# Patient Record
Sex: Male | Born: 1955
Health system: Southern US, Community
[De-identification: ages and names within clinical notes are randomized; demographics above are authoritative.]

## PROBLEM LIST (undated history)

## (undated) DIAGNOSIS — J984 Other disorders of lung: Secondary | ICD-10-CM

## (undated) DIAGNOSIS — J189 Pneumonia, unspecified organism: Secondary | ICD-10-CM

## (undated) DIAGNOSIS — A150 Tuberculosis of lung: Secondary | ICD-10-CM

---

## 2004-10-29 ENCOUNTER — Emergency Department (HOSPITAL_COMMUNITY): Admission: AC | Admit: 2004-10-29 | Discharge: 2004-10-29 | Payer: Self-pay

## 2010-06-04 ENCOUNTER — Emergency Department (HOSPITAL_COMMUNITY): Admission: EM | Admit: 2010-06-04 | Discharge: 2010-06-04 | Payer: Self-pay | Admitting: Family Medicine

## 2011-01-12 LAB — POCT URINALYSIS DIPSTICK
Glucose, UA: NEGATIVE mg/dL
Hgb urine dipstick: NEGATIVE
Nitrite: NEGATIVE
Urobilinogen, UA: 1 mg/dL (ref 0.0–1.0)
pH: 8 (ref 5.0–8.0)

## 2011-01-12 LAB — POCT I-STAT, CHEM 8
BUN: 13 mg/dL (ref 6–23)
Calcium, Ion: 1.18 mmol/L (ref 1.12–1.32)
Hemoglobin: 15.6 g/dL (ref 13.0–17.0)
TCO2: 29 mmol/L (ref 0–100)

## 2011-01-12 LAB — URINE CULTURE

## 2012-08-31 ENCOUNTER — Encounter (HOSPITAL_COMMUNITY): Payer: Self-pay

## 2012-08-31 ENCOUNTER — Emergency Department (HOSPITAL_COMMUNITY)
Admission: EM | Admit: 2012-08-31 | Discharge: 2012-08-31 | Disposition: A | Discharge status: Home / Self Care | Payer: 59 | Attending: Emergency Medicine | Admitting: Emergency Medicine

## 2012-08-31 DIAGNOSIS — R1013 Epigastric pain: Secondary | ICD-10-CM | POA: Insufficient documentation

## 2012-08-31 DIAGNOSIS — R109 Unspecified abdominal pain: Secondary | ICD-10-CM

## 2012-08-31 DIAGNOSIS — R7309 Other abnormal glucose: Secondary | ICD-10-CM | POA: Insufficient documentation

## 2012-08-31 DIAGNOSIS — R739 Hyperglycemia, unspecified: Secondary | ICD-10-CM

## 2012-08-31 LAB — COMPREHENSIVE METABOLIC PANEL
BUN: 17 mg/dL (ref 6–23)
CO2: 29 mEq/L (ref 19–32)
Chloride: 99 mEq/L (ref 96–112)
Creatinine, Ser: 0.82 mg/dL (ref 0.50–1.35)
GFR calc non Af Amer: >90 mL/min (ref >90)
Glucose, Bld: 147 mg/dL — ABNORMAL HIGH (ref 70–99)
Total Bilirubin: 1.1 mg/dL (ref 0.3–1.2)

## 2012-08-31 LAB — CBC WITH DIFFERENTIAL/PLATELET
HCT: 38.5 % — ABNORMAL LOW (ref 39.0–52.0)
Hemoglobin: 13.8 g/dL (ref 13.0–17.0)
Lymphocytes Relative: 11 % — ABNORMAL LOW (ref 12–46)
Monocytes Absolute: 0.3 10*3/uL (ref 0.1–1.0)
Monocytes Relative: 3 % (ref 3–12)
Neutro Abs: 7 10*3/uL (ref 1.7–7.7)
WBC: 8.2 10*3/uL (ref 4.0–10.5)

## 2012-08-31 LAB — LIPASE, BLOOD: Lipase: 29 U/L (ref 11–59)

## 2012-08-31 MED ORDER — PANTOPRAZOLE SODIUM 40 MG PO TBEC: 40.0000 mg | DELAYED_RELEASE_TABLET | Freq: Every day | ORAL | Status: DC

## 2012-08-31 MED ORDER — GI COCKTAIL ~~LOC~~
30.0000 mL | Freq: Once | ORAL | Status: AC
  Administered 2012-08-31: 30 mL via ORAL
  Filled 2012-08-31: qty 30

## 2012-08-31 MED ORDER — ONDANSETRON 4 MG PO TBDP
8.0000 mg | ORAL_TABLET | Freq: Once | ORAL | Status: AC
  Administered 2012-08-31: 8 mg via ORAL
  Filled 2012-08-31: qty 2

## 2012-08-31 NOTE — ED Provider Notes (Signed)
History     CSN: [id number]  Arrival date & time 08/31/12  2841   First MD Initiated Contact with Patient 08/31/12 0405      Chief Complaint  Patient presents with  . Abdominal Pain    (Consider location/radiation/quality/duration/timing/severity/associated sxs/prior treatment) Patient is a 56 y.o. male presenting with abdominal pain. The history is provided by the patient.  Abdominal Pain The primary symptoms of the illness include abdominal pain.  He noted onset last night at about 11 PM of epigastric pain without radiation. It was moderate in intensity. She rated pain as 6/10 at its worst. Was associated nausea and vomiting, and he feels significantly better following emesis. Denies constipation or diarrhea. Denies fever, chills, sweats. Nothing made the pain worse and nothing improves it except for vomiting. He has not had similar pain for  No past medical history on file.  No past surgical history on file.  No family history on file.  History  Substance Use Topics  . Smoking status: Never Smoker   . Smokeless tobacco: Not on file  . Alcohol Use: No      Review of Systems  Gastrointestinal: Positive for abdominal pain.  All other systems reviewed and are negative.    Allergies  Review of patient's allergies indicates no known allergies.  Home Medications   Current Outpatient Rx  Name  Route  Sig  Dispense  Refill  . IBUPROFEN 200 MG PO TABS   Oral   Take 200 mg by mouth every 6 (six) hours as needed. For pain           BP 156/73  Pulse 92  Temp 98 F (36.7 C) (Oral)  Resp 16  SpO2 98%  Physical Exam  Nursing note and vitals reviewed. 56 year old male, resting comfortably and in no acute distress. Vital signs are significant for hypertension with blood pressure 136/73. Oxygen saturation is 98%, which is normal. Head is normocephalic and atraumatic. PERRLA, EOMI. Oropharynx is clear. Neck is nontender and supple without adenopathy or JVD. Back is  nontender and there is no CVA tenderness. Lungs are clear without rales, wheezes, or rhonchi. Chest is nontender. Heart has regular rate and rhythm without murmur. Abdomen is soft, flat, nontender without masses or hepatosplenomegaly and peristalsis is normoactive. Extremities have no cyanosis or edema, full range of motion is present. Skin is warm and dry without rash. Neurologic: Mental status is normal, cranial nerves are intact, there are no motor or sensory deficits.   ED Course  Procedures (including critical care time)  Results for orders placed during the hospital encounter of 08/31/12  CBC WITH DIFFERENTIAL      Component Value Range   WBC 8.2  4.0 - 10.5 K/uL   RBC 4.58  4.22 - 5.81 MIL/uL   Hemoglobin 13.8  13.0 - 17.0 g/dL   HCT 32.4 (*) 40.1 - 02.7 %   MCV 84.1  78.0 - 100.0 fL   MCH 30.1  26.0 - 34.0 pg   MCHC 35.8  30.0 - 36.0 g/dL   RDW 25.3  66.4 - 40.3 %   Platelets 127 (*) 150 - 400 K/uL   Neutrophils Relative 86 (*) 43 - 77 %   Neutro Abs 7.0  1.7 - 7.7 K/uL   Lymphocytes Relative 11 (*) 12 - 46 %   Lymphs Abs 0.9  0.7 - 4.0 K/uL   Monocytes Relative 3  3 - 12 %   Monocytes Absolute 0.3  0.1 - 1.0  K/uL   Eosinophils Relative 0  0 - 5 %   Eosinophils Absolute 0.0  0.0 - 0.7 K/uL   Basophils Relative 0  0 - 1 %   Basophils Absolute 0.0  0.0 - 0.1 K/uL  COMPREHENSIVE METABOLIC PANEL      Component Value Range   Sodium 136  135 - 145 mEq/L   Potassium 4.3  3.5 - 5.1 mEq/L   Chloride 99  96 - 112 mEq/L   CO2 29  19 - 32 mEq/L   Glucose, Bld 147 (*) 70 - 99 mg/dL   BUN 17  6 - 23 mg/dL   Creatinine, Ser 1.61  0.50 - 1.35 mg/dL   Calcium 9.3  8.4 - 09.6 mg/dL   Total Protein 7.6  6.0 - 8.3 g/dL   Albumin 4.4  3.5 - 5.2 g/dL   AST 21  0 - 37 U/L   ALT 14  0 - 53 U/L   Alkaline Phosphatase 40  39 - 117 U/L   Total Bilirubin 1.1  0.3 - 1.2 mg/dL   GFR calc non Af Amer >90  >90 mL/min   GFR calc Af Amer >90  >90 mL/min  LIPASE, BLOOD      Component Value  Range   Lipase 29  11 - 59 U/L    1. Abdominal pain   2. Hyperglycemia       MDM  Abdominal pain of uncertain etiology. It seems to have mostly resolved. You'll be given a GI cocktail and reassessed.  The pain was significantly improved following GI cocktail. He is sent home with a prescription for pantoprazole and is advised of his blood sugars followed closely to make sure he is not diabetic.      Dione Booze, MD 08/31/12 [phone]

## 2012-08-31 NOTE — ED Notes (Signed)
Pt states understanding of discharge instructions 

## 2012-08-31 NOTE — ED Notes (Signed)
Pt here with vomiting and abd pain, onset tonight

## 2017-01-11 DIAGNOSIS — R5381 Other malaise: Secondary | ICD-10-CM | POA: Diagnosis not present

## 2017-01-11 DIAGNOSIS — K219 Gastro-esophageal reflux disease without esophagitis: Secondary | ICD-10-CM | POA: Diagnosis not present

## 2017-01-15 DIAGNOSIS — Z13220 Encounter for screening for lipoid disorders: Secondary | ICD-10-CM | POA: Diagnosis not present

## 2017-01-15 DIAGNOSIS — Z131 Encounter for screening for diabetes mellitus: Secondary | ICD-10-CM | POA: Diagnosis not present

## 2017-08-15 DIAGNOSIS — R040 Epistaxis: Secondary | ICD-10-CM | POA: Diagnosis not present

## 2017-08-15 DIAGNOSIS — I10 Essential (primary) hypertension: Secondary | ICD-10-CM | POA: Diagnosis not present

## 2017-08-23 DIAGNOSIS — I10 Essential (primary) hypertension: Secondary | ICD-10-CM | POA: Diagnosis not present

## 2017-08-23 DIAGNOSIS — K5900 Constipation, unspecified: Secondary | ICD-10-CM | POA: Diagnosis not present

## 2017-08-23 DIAGNOSIS — K649 Unspecified hemorrhoids: Secondary | ICD-10-CM | POA: Diagnosis not present

## 2017-10-09 ENCOUNTER — Encounter (HOSPITAL_COMMUNITY): Payer: Self-pay | Admitting: Emergency Medicine

## 2017-10-09 ENCOUNTER — Emergency Department (HOSPITAL_COMMUNITY): Payer: 59

## 2017-10-09 ENCOUNTER — Other Ambulatory Visit: Payer: Self-pay

## 2017-10-09 ENCOUNTER — Inpatient Hospital Stay (HOSPITAL_COMMUNITY)
Admission: EM | Admit: 2017-10-09 | Discharge: 2017-10-16 | DRG: 178 | Disposition: A | Discharge status: Home / Self Care | Payer: 59 | Attending: Family Medicine | Admitting: Family Medicine

## 2017-10-09 DIAGNOSIS — J42 Unspecified chronic bronchitis: Secondary | ICD-10-CM | POA: Diagnosis present

## 2017-10-09 DIAGNOSIS — E0781 Sick-euthyroid syndrome: Secondary | ICD-10-CM | POA: Diagnosis present

## 2017-10-09 DIAGNOSIS — R848 Other abnormal findings in specimens from respiratory organs and thorax: Secondary | ICD-10-CM | POA: Diagnosis not present

## 2017-10-09 DIAGNOSIS — J189 Pneumonia, unspecified organism: Secondary | ICD-10-CM | POA: Diagnosis not present

## 2017-10-09 DIAGNOSIS — Z789 Other specified health status: Secondary | ICD-10-CM

## 2017-10-09 DIAGNOSIS — R000 Tachycardia, unspecified: Secondary | ICD-10-CM | POA: Diagnosis not present

## 2017-10-09 DIAGNOSIS — J984 Other disorders of lung: Secondary | ICD-10-CM | POA: Diagnosis not present

## 2017-10-09 DIAGNOSIS — J188 Other pneumonia, unspecified organism: Secondary | ICD-10-CM | POA: Diagnosis present

## 2017-10-09 DIAGNOSIS — J181 Lobar pneumonia, unspecified organism: Secondary | ICD-10-CM

## 2017-10-09 DIAGNOSIS — A150 Tuberculosis of lung: Secondary | ICD-10-CM

## 2017-10-09 DIAGNOSIS — R0902 Hypoxemia: Secondary | ICD-10-CM

## 2017-10-09 DIAGNOSIS — R042 Hemoptysis: Secondary | ICD-10-CM | POA: Diagnosis present

## 2017-10-09 DIAGNOSIS — R590 Localized enlarged lymph nodes: Secondary | ICD-10-CM

## 2017-10-09 DIAGNOSIS — R079 Chest pain, unspecified: Secondary | ICD-10-CM | POA: Diagnosis not present

## 2017-10-09 HISTORY — DX: Other disorders of lung: J98.4

## 2017-10-09 HISTORY — DX: Pneumonia, unspecified organism: J18.9

## 2017-10-09 HISTORY — DX: Tuberculosis of lung: A15.0

## 2017-10-09 LAB — BASIC METABOLIC PANEL
ANION GAP: 10 (ref 5–15)
BUN: 10 mg/dL (ref 6–20)
CALCIUM: 9.2 mg/dL (ref 8.9–10.3)
CO2: 27 mmol/L (ref 22–32)
CREATININE: 0.88 mg/dL (ref 0.61–1.24)
Chloride: 99 mmol/L — ABNORMAL LOW (ref 101–111)
GLUCOSE: 104 mg/dL — AB (ref 65–99)
Potassium: 3.9 mmol/L (ref 3.5–5.1)
Sodium: 136 mmol/L (ref 135–145)

## 2017-10-09 LAB — CBC
HCT: 37.3 % — ABNORMAL LOW (ref 39.0–52.0)
Hemoglobin: 12.8 g/dL — ABNORMAL LOW (ref 13.0–17.0)
MCH: 29.6 pg (ref 26.0–34.0)
MCHC: 34.3 g/dL (ref 30.0–36.0)
MCV: 86.3 fL (ref 78.0–100.0)
PLATELETS: 206 10*3/uL (ref 150–400)
RBC: 4.32 MIL/uL (ref 4.22–5.81)
RDW: 12.8 % (ref 11.5–15.5)
WBC: 6.6 10*3/uL (ref 4.0–10.5)

## 2017-10-09 LAB — RAPID HIV SCREEN (HIV 1/2 AB+AG)
HIV 1/2 ANTIBODIES: NONREACTIVE
HIV-1 P24 ANTIGEN - HIV24: NONREACTIVE

## 2017-10-09 LAB — I-STAT TROPONIN, ED: TROPONIN I, POC: 0 ng/mL (ref 0.00–0.08)

## 2017-10-09 MED ORDER — DEXTROSE 5 % IV SOLN
1.0000 g | INTRAVENOUS | Status: AC
  Administered 2017-10-09 – 2017-10-15 (×7): 1 g via INTRAVENOUS
  Filled 2017-10-09 (×6): qty 10

## 2017-10-09 MED ORDER — DEXTROSE 5 % IV SOLN
1.0000 g | Freq: Once | INTRAVENOUS | Status: DC
  Filled 2017-10-09: qty 10

## 2017-10-09 MED ORDER — AZITHROMYCIN 250 MG PO TABS
500.0000 mg | ORAL_TABLET | Freq: Once | ORAL | Status: AC
  Administered 2017-10-09: 500 mg via ORAL
  Filled 2017-10-09: qty 2

## 2017-10-09 MED ORDER — AZITHROMYCIN 250 MG PO TABS
500.0000 mg | ORAL_TABLET | ORAL | Status: DC
  Administered 2017-10-10 – 2017-10-13 (×4): 500 mg via ORAL
  Filled 2017-10-09 (×4): qty 2

## 2017-10-09 NOTE — H&P (Signed)
History and Physical    Jason Simpsondi Saylor ZOX:[id number] DOB: 01/03/1956 DOA: 10/09/2017  PCP: Rometta EmeryGarba, Mohammad L, MD  Patient coming from: Home  I have personally briefly reviewed patient's old medical [facility]  Chief Complaint: CP  HPI: Jason Clark is a 61 y.o. male with medical history significant of previously healthy.  Patient presents to ED with c/o CP that waxes and wanes for the last 3 weeks.  This has been getting worse especially since last Friday.  L sided chest pain, tightness and pressure; w/ SOB.  Pt able to ambulate to chair.  6/10 Pain.  Associated cough and hemoptysis over the weekend.   ED Course: CXR shows LEFT suprahilar density.  CT showed dense airspace consolidation of the anterior left upper lobe with small areas of internal cavitation C/W acute PNA.  Also hilar lymph nodes, either prior granulomatous disease or active TB.  Review of Systems: As per HPI otherwise 10 point review of systems negative.   History reviewed. No pertinent past medical history.  History reviewed. No pertinent surgical history.   reports that  has never smoked. he has never used smokeless tobacco. He reports that he does not drink alcohol or use drugs.  No Known Allergies  No family history on file. No known exposure to TB, no family members with TB he says.  Prior to Admission medications   Medication Sig Start Date End Date Taking? Authorizing Provider  ibuprofen (ADVIL) 200 MG tablet Take 200 mg by mouth every 6 (six) hours as needed. For pain    [provider]  pantoprazole (PROTONIX) 40 MG tablet Take 1 tablet (40 mg total) by mouth daily. 08/31/12   Dione BoozeGlick, David, MD    Physical Exam: Vitals:   10/09/17 1331 10/09/17 1749 10/09/17 1926  BP: (!) 147/88 (!) 150/79 (!) 150/81  Pulse: (!) 110 (!) 102 100  Resp: 16 18 18   Temp: 98 F (36.7 C)    SpO2: 100% 99% 97%  Weight: 74.8 kg (165 lb)    Height: 5\' 6"  (1.676 m)      Constitutional: NAD, calm,  comfortable Eyes: PERRL, lids and conjunctivae normal ENMT: Mucous membranes are moist. Posterior pharynx clear of any exudate or lesions.Normal dentition.  Neck: normal, supple, no masses, no thyromegaly Respiratory: clear to auscultation bilaterally, no wheezing, no crackles. Normal respiratory effort. No accessory muscle use.  Cardiovascular: Regular rate and rhythm, no murmurs / rubs / gallops. No extremity edema. 2+ pedal pulses. No carotid bruits.  Abdomen: no tenderness, no masses palpated. No hepatosplenomegaly. Bowel sounds positive.  Musculoskeletal: no clubbing / cyanosis. No joint deformity upper and lower extremities. Good ROM, no contractures. Normal muscle tone.  Skin: no rashes, lesions, ulcers. No induration Neurologic: CN 2-12 grossly intact. Sensation intact, DTR normal. Strength 5/5 in all 4.  Psychiatric: Normal judgment and insight. Alert and oriented x 3. Normal mood.    Labs on Admission: I have personally reviewed following labs and imaging studies  CBC: Recent Labs  Lab 10/09/17 1333  WBC 6.6  HGB 12.8*  HCT 37.3*  MCV 86.3  PLT 206   Basic Metabolic Panel: Recent Labs  Lab 10/09/17 1333  NA 136  K 3.9  CL 99*  CO2 27  GLUCOSE 104*  BUN 10  CREATININE 0.88  CALCIUM 9.2   GFR: Estimated Creatinine Clearance: 81.6 mL/min (by C-G formula based on SCr of 0.88 mg/dL). Liver Function Tests: No results for input(s): AST, ALT, ALKPHOS, BILITOT, PROT, ALBUMIN  in the last 168 hours. No results for input(s): LIPASE, AMYLASE in the last 168 hours. No results for input(s): AMMONIA in the last 168 hours. Coagulation Profile: No results for input(s): INR, PROTIME in the last 168 hours. Cardiac Enzymes: No results for input(s): CKTOTAL, CKMB, CKMBINDEX, TROPONINI in the last 168 hours. BNP (last 3 results) No results for input(s): PROBNP in the last 8760 hours. HbA1C: No results for input(s): HGBA1C in the last 72 hours. CBG: No results for input(s):  GLUCAP in the last 168 hours. Lipid Profile: No results for input(s): CHOL, HDL, LDLCALC, TRIG, CHOLHDL, LDLDIRECT in the last 72 hours. Thyroid Function Tests: No results for input(s): TSH, T4TOTAL, FREET4, T3FREE, THYROIDAB in the last 72 hours. Anemia Panel: No results for input(s): VITAMINB12, FOLATE, FERRITIN, TIBC, IRON, RETICCTPCT in the last 72 hours. Urine analysis:    Component Value Date/Time   LABSPEC 1.020 06/04/2010 1553   PHURINE 8.0 06/04/2010 1553   GLUCOSEU NEGATIVE 06/04/2010 1553   HGBUR NEGATIVE 06/04/2010 1553   BILIRUBINUR NEGATIVE 06/04/2010 1553   KETONESUR NEGATIVE 06/04/2010 1553   PROTEINUR NEGATIVE 06/04/2010 1553   UROBILINOGEN 1.0 06/04/2010 1553   NITRITE NEGATIVE 06/04/2010 1553   LEUKOCYTESUR (A) 06/04/2010 1553    SMALL Biochemical Testing Only. Please order routine urinalysis from main lab if confirmatory testing is needed.    Radiological Exams on Admission: Dg Chest 2 View  Result Date: 10/09/2017 CLINICAL DATA:  Chest pain since friday EXAM: CHEST  2 VIEW COMPARISON:  Radiograph 11 10/2004. FINDINGS: Normal cardiac silhouette. There is subtle asymmetric LEFT suprahilar angular density not clearly seen on comparison exam. No consolidation. No pleural fluid or pneumothorax. No acute osseous abnormality. IMPRESSION: Subtle asymmetric LEFT suprahilar density. Recommend CT thorax for further evaluation Electronically Signed   By: Genevive Bi M.D.   On: 10/09/2017 14:00   Ct Chest Wo Contrast  Result Date: 10/09/2017 CLINICAL DATA:  Left-sided chest pain and shortness of breath. Abnormal chest x-ray demonstrating suprahilar density in the left chest. EXAM: CT CHEST WITHOUT CONTRAST TECHNIQUE: Multidetector CT imaging of the chest was performed following the standard protocol without IV contrast. COMPARISON:  Chest x-ray earlier today. FINDINGS: Cardiovascular: Normal heart size. No pericardial fluid. Normal caliber thoracic aorta and central  pulmonary arteries. No evidence of calcified coronary artery plaque. Mediastinum/Nodes: Numerous small high density calcified lymph nodes are identified throughout the mediastinum and in both hilar regions. Findings are suggestive of either prior granulomatous infection or other treated disease. No enlarged lymph nodes are identified. Lungs/Pleura: Dense subpleural airspace consolidation of the anterior left upper lobe noted extending from the anterior hilum and abutting the subpleural lung anteriorly and medially bordering the anterior mediastinum. There are some small pockets of cavitation within the consolidated lung and findings are consistent with acute pneumonia. Given the evidence potential prior granulomatous disease, active tuberculosis should be excluded. Upper Abdomen: Calcified gallstones identified in the gallbladder lumen. Musculoskeletal: No chest wall mass or suspicious bone lesions identified. IMPRESSION: Dense airspace consolidation of the anterior left upper lobe with small areas of internal cavitation. Findings are consistent with acute pneumonia. Associated numerous high density calcified lymph nodes throughout the mediastinum and in both hilar regions. These nodes are suggestive of either prior granulomatous infection or other treated disease. Active tuberculosis should be excluded based on the presence of these high density lymph nodes and the pneumonic consolidation present. Electronically Signed   By: Irish Lack M.D.   On: 10/09/2017 18:12    EKG: Independently  reviewed.  Assessment/Plan Principal Problem:   Pulmonary cavitary lesion Active Problems:   Community acquired pneumonia of left upper lobe of lung (HCC)    1. Pulmonary cavitary lesions, mediastinal lymphadenopathy - DDx includes but not limited to CAP, MTB, other infection, neoplasm. 1. EDP spoke with Dr. Orvan Falconerampbell who will presumably see inpatient 2. On airborne isolation already 3. CAP pathway for  now 4. Rocephin / azithromycin 5. Sputum cultures 6. Sputum AFB smears and cultures 7. Rapid HIV screen  DVT prophylaxis: SCDs Code Status: Full Family Communication: No family in room Disposition Plan: Home after admit Consults called: ID Dr. Orvan Falconerampbell Admission status: Admit to inpatient - inpatient status for work up of cavitary lung lesions, rule out MTB   Hillary BowGARDNER, JARED M. DO [facility] Pager [phone]  If 7AM-7PM, please contact day team taking care of patient [email or web address] Password [email or web address]  10/09/2017, 8:32 PM

## 2017-10-09 NOTE — ED Notes (Signed)
IV did not give blood return.

## 2017-10-09 NOTE — ED Notes (Signed)
Urinal given to patient. Patient aware urine sample needed.

## 2017-10-09 NOTE — ED Notes (Signed)
Patient not coughing up sputum at this time.  Patient aware sputum sample needed.

## 2017-10-09 NOTE — ED Notes (Signed)
Room on 6N being cleaned and not ready. Nurse will call when it has been finished.

## 2017-10-09 NOTE — ED Notes (Signed)
Two unsuccessful attempt at IV access.

## 2017-10-09 NOTE — ED Triage Notes (Signed)
Per Pt he has been having centralized CP that waxes and wanes for the last 3 weeks. Pt comes into the ED today bc it has gotten worse.  L sided chest pain, tightness and pressure; w/ SOB.  Pt able to ambulate to chair. 6/10 Pain.

## 2017-10-09 NOTE — ED Provider Notes (Signed)
[facility] Provider Note   CSN: [id number] Arrival date & time: 10/09/17  1321     History   Chief Complaint Chief Complaint  Patient presents with  . Chest Pain    HPI Jason Clark is a 61 y.o. male who originates from [place].  He has been living here for the past 17 years.  He presents the emergency department with a chief complaint of cough.  Translation services were offered however the patient declined them and said that he wanted to speak to me in [place].  His primary language is [place] and [place].   He is followed by Dr. Mikeal HawthorneGarba.  The patient has had a persistent cough for some time.  Intermittently he will take NyQuil or DayQuil.  Over the past week it seems to have increased in frequency.  On Friday his coughing became significant with left-sided pleuritic chest pain.  He had one episode of hemoptysis.  He is coughing up sputum.  He denies fevers, chills or weight loss.  He denies wheezing but does complain of dyspnea both at rest and with exertion as well as pain with breathing.  The patient had an abnormal chest x-ray followed by a follow-up CT chest concerning for active tuberculosis while waiting in the ER lobby.    HPI  History reviewed. No pertinent past medical history.  There are no active problems to display for this patient.   History reviewed. No pertinent surgical history.     Home Medications    Prior to Admission medications   Medication Sig Start Date End Date Taking? Authorizing Provider  ibuprofen (ADVIL) 200 MG tablet Take 200 mg by mouth every 6 (six) hours as needed. For pain    [provider]  pantoprazole (PROTONIX) 40 MG tablet Take 1 tablet (40 mg total) by mouth daily. 08/31/12   Dione BoozeGlick, David, MD    Family History No family history on file.  Social History Social History   Tobacco Use  . Smoking status: Never Smoker  . Smokeless tobacco: Never Used  Substance Use Topics  . Alcohol use: No  .  Drug use: No     Allergies   Patient has no known allergies.   Review of Systems Review of Systems  Ten systems reviewed and are negative for acute change, except as noted in the HPI.   Physical Exam Updated Vital Signs BP (!) 150/81   Pulse 100   Temp 98 F (36.7 C)   Resp 18   Ht 5\' 6"  (1.676 m)   Wt 74.8 kg (165 lb)   SpO2 97%   BMI 26.63 kg/m   Physical Exam  Constitutional: He is oriented to person, place, and time. He appears well-developed and well-nourished. No distress.  HENT:  Head: Normocephalic and atraumatic.  Eyes: Conjunctivae and EOM are normal. Pupils are equal, round, and reactive to light. No scleral icterus.  Neck: Normal range of motion. Neck supple.  Cardiovascular: Normal rate, regular rhythm, normal heart sounds, intact distal pulses and normal pulses.  Pulmonary/Chest: Effort normal and breath sounds normal. No respiratory distress. He has no wheezes. He has no rhonchi.  Hyperresonance in the left upper lung field  Abdominal: Soft. There is no tenderness.  Musculoskeletal: Normal range of motion. He exhibits no edema.  Neurological: He is alert and oriented to person, place, and time.  Skin: Skin is warm and dry. He is not diaphoretic.  Psychiatric: His behavior is normal.  Nursing note and vitals  reviewed.    ED Treatments / Results  Labs (all labs ordered are listed, but only abnormal results are displayed) Labs Reviewed  BASIC METABOLIC PANEL - Abnormal; Notable for the following components:      Result Value   Chloride 99 (*)    Glucose, Bld 104 (*)    All other components within normal limits  CBC - Abnormal; Notable for the following components:   Hemoglobin 12.8 (*)    HCT 37.3 (*)    All other components within normal limits  ACID FAST SMEAR (AFB)  ACID FAST CULTURE WITH REFLEXED SENSITIVITIES  CULTURE, BLOOD (ROUTINE X 2)  CULTURE, BLOOD (ROUTINE X 2)  RAPID HIV SCREEN (HIV 1/2 AB+AG)  I-STAT TROPONIN, ED    EKG  EKG  Interpretation None       Radiology Dg Chest 2 View  Result Date: 10/09/2017 CLINICAL DATA:  Chest pain since friday EXAM: CHEST  2 VIEW COMPARISON:  Radiograph 11 10/2004. FINDINGS: Normal cardiac silhouette. There is subtle asymmetric LEFT suprahilar angular density not clearly seen on comparison exam. No consolidation. No pleural fluid or pneumothorax. No acute osseous abnormality. IMPRESSION: Subtle asymmetric LEFT suprahilar density. Recommend CT thorax for further evaluation Electronically Signed   By: Genevive BiStewart  Edmunds M.D.   On: 10/09/2017 14:00   Ct Chest Wo Contrast  Result Date: 10/09/2017 CLINICAL DATA:  Left-sided chest pain and shortness of breath. Abnormal chest x-ray demonstrating suprahilar density in the left chest. EXAM: CT CHEST WITHOUT CONTRAST TECHNIQUE: Multidetector CT imaging of the chest was performed following the standard protocol without IV contrast. COMPARISON:  Chest x-ray earlier today. FINDINGS: Cardiovascular: Normal heart size. No pericardial fluid. Normal caliber thoracic aorta and central pulmonary arteries. No evidence of calcified coronary artery plaque. Mediastinum/Nodes: Numerous small high density calcified lymph nodes are identified throughout the mediastinum and in both hilar regions. Findings are suggestive of either prior granulomatous infection or other treated disease. No enlarged lymph nodes are identified. Lungs/Pleura: Dense subpleural airspace consolidation of the anterior left upper lobe noted extending from the anterior hilum and abutting the subpleural lung anteriorly and medially bordering the anterior mediastinum. There are some small pockets of cavitation within the consolidated lung and findings are consistent with acute pneumonia. Given the evidence potential prior granulomatous disease, active tuberculosis should be excluded. Upper Abdomen: Calcified gallstones identified in the gallbladder lumen. Musculoskeletal: No chest wall mass or  suspicious bone lesions identified. IMPRESSION: Dense airspace consolidation of the anterior left upper lobe with small areas of internal cavitation. Findings are consistent with acute pneumonia. Associated numerous high density calcified lymph nodes throughout the mediastinum and in both hilar regions. These nodes are suggestive of either prior granulomatous infection or other treated disease. Active tuberculosis should be excluded based on the presence of these high density lymph nodes and the pneumonic consolidation present. Electronically Signed   By: Irish LackGlenn  Yamagata M.D.   On: 10/09/2017 18:12    Procedures Procedures (including critical care time)  Medications Ordered in ED Medications  cefTRIAXone (ROCEPHIN) 1 g in dextrose 5 % 50 mL IVPB (not administered)  azithromycin (ZITHROMAX) tablet 500 mg (not administered)     Initial Impression / Assessment and Plan / ED Course  I have reviewed the triage vital signs and the nursing notes.  Pertinent labs & imaging results that were available during my care of the patient were reviewed by me and considered in my medical decision making (see chart for details).  Clinical Course as of Oct 10 2107  Wed Oct 09, 2017  1720 Chest x-ray with subtle asymmetric left suprahilar density, called and spoke with reading radiologist, Dr. Amil Amen, regarding f/u CT. Recommended non-contrast chest CT, which I have ordered.   [MM]  1845 Reviewed CT impression, concerning for active TB. Spoke with charge RN who will leave the patient in a negative pressure room.   [MM]  1953 I discussed the case with Dr. Orvan Falconer who is on-call for infectious disease.  He recommends inpatient admission, screening for HIV, acid-fast bacilli sputum assessment, blood cultures.  I have placed consult call with her hospitalist.  Patient will also be treated with azithromycin and Rocephin for community-acquired pneumonia.  [AH]    Clinical Course User Index [AH] Arthor Captain,  PA-C [MM] McDonald, Coral Else, PA-C      Final Clinical Impressions(s) / ED Diagnoses   Final diagnoses:  None    ED Discharge Orders    None       Arthor Captain, PA-C 10/10/17 0149    Nira Conn, MD 10/12/17 [phone]

## 2017-10-10 ENCOUNTER — Other Ambulatory Visit: Payer: Self-pay

## 2017-10-10 ENCOUNTER — Encounter (HOSPITAL_COMMUNITY): Payer: Self-pay | Admitting: General Practice

## 2017-10-10 DIAGNOSIS — J189 Pneumonia, unspecified organism: Secondary | ICD-10-CM

## 2017-10-10 LAB — STREP PNEUMONIAE URINARY ANTIGEN: STREP PNEUMO URINARY ANTIGEN: NEGATIVE

## 2017-10-10 NOTE — Consult Note (Addendum)
[facility]    Date of Admission:  10/09/2017   Total days of antibiotics: 1 caftriaxone/azithro               Reason for Consult: Cavitary pneumonia     Referring Provider: Hongalgi   Assessment: Cavitary Pneumonia  Plan: 1. Continue rx for CAP 2. Induce sputum for AFB, will ask repsiratory (per nursing he is not producing sputum) 3. If not able to get sputum, BAL 4. HIV (-) 5. No need for quantiferon gold testing in acute setting- if we believe he has active TB, best to go to the source.   Comment-  Very suspicious of Tb given his country of origin and CT and hemoptysis.   Thank you so much for this interesting consult,  Principal Problem:   Pulmonary cavitary lesion Active Problems:   Community acquired pneumonia of left upper lobe of lung ([place])   . azithromycin  500 mg Oral Q24H    HPI: Jason Clark is a 61 y.o. male with no PMHx, originally from [place], came to [place] "2000 something" . Last visit to his home country was  (can't remember).  No hx of incarceration, homelessness.  Comes to ED on 12-12 with CP for 3 weeks and cough and hemoptysis over the last 4 days.  No Fever, no Chills, no wt loss, no LAN.  In ED his CXR showed L suprahilar density, CT showed LUL infiltrate with cavitation and hilar LN. He was afebrile, CBC normal.  He was started on azithro, ceftriaxone, placed isolation.    Review of Systems: Review of Systems  Constitutional: Negative for chills, fever and weight loss.  Respiratory: Positive for cough, hemoptysis and sputum production.   Cardiovascular: Positive for chest pain.  Gastrointestinal: Negative for constipation and diarrhea.  Genitourinary: Negative for dysuria.    Past Medical History:  Diagnosis Date  . CAP (community acquired pneumonia) 10/09/2017  . Pulmonary cavitary lesion 10/09/2017    Social History   Tobacco Use  . Smoking status: Never Smoker  . Smokeless tobacco: Never Used    Substance Use Topics  . Alcohol use: No  . Drug use: No    History reviewed. No pertinent family history.  The past medical history, family history and social history were reviewed/updated in EPIC   Medications:  Scheduled: . azithromycin  500 mg Oral Q24H    Abtx:  Anti-infectives (From admission, onward)   Start     Dose/Rate Route Frequency Ordered Stop   10/10/17 2000  azithromycin (ZITHROMAX) tablet 500 mg     500 mg Oral Every 24 hours 10/09/17 2023 10/17/17 1959   10/09/17 2100  cefTRIAXone (ROCEPHIN) 1 g in dextrose 5 % 50 mL IVPB     1 g 100 mL/hr over 30 Minutes Intravenous Every 24 hours 10/09/17 2023 10/16/17 2059   10/09/17 1945  cefTRIAXone (ROCEPHIN) 1 g in dextrose 5 % 50 mL IVPB  Status:  Discontinued     1 g 100 mL/hr over 30 Minutes Intravenous  Once 10/09/17 1939 10/09/17 2026   10/09/17 1945  azithromycin (ZITHROMAX) tablet 500 mg     500 mg Oral  Once 10/09/17 1939 10/09/17 2016        OBJECTIVE: Blood pressure 117/71, pulse 98, temperature 98.6 F (37 C), temperature source Oral, resp. rate 18, height _0  (1.727 m), weight 74.8 kg (165 lb), SpO2 100 %.  Physical Exam  Constitutional: He is well-developed,  well-nourished, and in no distress. No distress.  HENT:  Mouth/Throat: Abnormal dentition. No oropharyngeal exudate.  Eyes: EOM are normal. Pupils are equal, round, and reactive to light.  Neck: Neck supple.  Cardiovascular: Normal rate, regular rhythm and normal heart sounds.  Pulmonary/Chest: Effort normal. He has rhonchi.  Abdominal: Soft. Bowel sounds are normal. There is no tenderness. There is no rebound.  Musculoskeletal: He exhibits no edema.  Lymphadenopathy:    He has no cervical adenopathy.    He has no axillary adenopathy.       Right: No supraclavicular adenopathy present.       Left: No supraclavicular adenopathy present.  Skin: He is not diaphoretic.  Psychiatric: Affect normal.    Lab Results Results for orders  placed or performed during the hospital encounter of 10/09/17 (from the past 48 hour(s))  Basic metabolic panel     Status: Abnormal   Collection Time: 10/09/17  1:33 PM  Result Value Ref Range   Sodium 136 135 - 145 mmol/L   Potassium 3.9 3.5 - 5.1 mmol/L   Chloride 99 (L) 101 - 111 mmol/L   CO2 27 22 - 32 mmol/L   Glucose, Bld 104 (H) 65 - 99 mg/dL   BUN 10 6 - 20 mg/dL   Creatinine, Ser 0.88 0.61 - 1.24 mg/dL   Calcium 9.2 8.9 - 10.3 mg/dL   GFR calc non Af Amer >60 >60 mL/min   GFR calc Af Amer >60 >60 mL/min    Comment: (NOTE) The eGFR has been calculated using the CKD EPI equation. This calculation has not been validated in all clinical situations. eGFR's persistently <60 mL/min signify possible Chronic Kidney Disease.    Anion gap 10 5 - 15  CBC     Status: Abnormal   Collection Time: 10/09/17  1:33 PM  Result Value Ref Range   WBC 6.6 4.0 - 10.5 K/uL   RBC 4.32 4.22 - 5.81 MIL/uL   Hemoglobin 12.8 (L) 13.0 - 17.0 g/dL   HCT 37.3 (L) 39.0 - 52.0 %   MCV 86.3 78.0 - 100.0 fL   MCH 29.6 26.0 - 34.0 pg   MCHC 34.3 30.0 - 36.0 g/dL   RDW 12.8 11.5 - 15.5 %   Platelets 206 150 - 400 K/uL  I-stat troponin, ED     Status: None   Collection Time: 10/09/17  1:43 PM  Result Value Ref Range   Troponin i, poc 0.00 0.00 - 0.08 ng/mL   Comment 3            Comment: Due to the release kinetics of cTnI, a negative result within the first hours of the onset of symptoms does not rule out myocardial infarction with certainty. If myocardial infarction is still suspected, repeat the test at appropriate intervals.   Blood culture (routine x 2)     Status: None (Preliminary result)   Collection Time: 10/09/17  8:15 PM  Result Value Ref Range   Specimen Description BLOOD LEFT ANTECUBITAL    Special Requests      BOTTLES DRAWN AEROBIC AND ANAEROBIC Blood Culture adequate volume   Culture NO GROWTH < 24 HOURS    Report Status PENDING   Rapid HIV screen (HIV 1/2 Ab+Ag)     Status:  None   Collection Time: 10/09/17  8:40 PM  Result Value Ref Range   HIV-1 P24 Antigen - HIV24 NON REACTIVE NON REACTIVE   HIV 1/2 Antibodies NON REACTIVE NON REACTIVE   Interpretation (HIV  Ag Ab)      A non reactive test result means that HIV 1 or HIV 2 antibodies and HIV 1 p24 antigen were not detected in the specimen.  Blood culture (routine x 2)     Status: None (Preliminary result)   Collection Time: 10/09/17  8:47 PM  Result Value Ref Range   Specimen Description BLOOD LEFT ARM    Special Requests      BLOOD AEROBIC BOTTLE Blood Culture results may not be optimal due to an inadequate volume of blood received in culture bottles   Culture NO GROWTH < 24 HOURS    Report Status PENDING   Strep pneumoniae urinary antigen     Status: None   Collection Time: 10/10/17  1:02 AM  Result Value Ref Range   Strep Pneumo Urinary Antigen NEGATIVE NEGATIVE    Comment:        Infection due to S. pneumoniae cannot be absolutely ruled out since the antigen present may be below the detection limit of the test.       Component Value Date/Time   SDES BLOOD LEFT ARM 10/09/2017 2047   SPECREQUEST  10/09/2017 2047    BLOOD AEROBIC BOTTLE Blood Culture results may not be optimal due to an inadequate volume of blood received in culture bottles   CULT NO GROWTH < 24 HOURS 10/09/2017 2047   REPTSTATUS PENDING 10/09/2017 2047   Dg Chest 2 View  Result Date: 10/09/2017 CLINICAL DATA:  Chest pain since friday EXAM: CHEST  2 VIEW COMPARISON:  Radiograph 11 10/2004. FINDINGS: Normal cardiac silhouette. There is subtle asymmetric LEFT suprahilar angular density not clearly seen on comparison exam. No consolidation. No pleural fluid or pneumothorax. No acute osseous abnormality. IMPRESSION: Subtle asymmetric LEFT suprahilar density. Recommend CT thorax for further evaluation Electronically Signed   By: Suzy Bouchard M.D.   On: 10/09/2017 14:00   Ct Chest Wo Contrast  Result Date: 10/09/2017 CLINICAL  DATA:  Left-sided chest pain and shortness of breath. Abnormal chest x-ray demonstrating suprahilar density in the left chest. EXAM: CT CHEST WITHOUT CONTRAST TECHNIQUE: Multidetector CT imaging of the chest was performed following the standard protocol without IV contrast. COMPARISON:  Chest x-ray earlier today. FINDINGS: Cardiovascular: Normal heart size. No pericardial fluid. Normal caliber thoracic aorta and central pulmonary arteries. No evidence of calcified coronary artery plaque. Mediastinum/Nodes: Numerous small high density calcified lymph nodes are identified throughout the mediastinum and in both hilar regions. Findings are suggestive of either prior granulomatous infection or other treated disease. No enlarged lymph nodes are identified. Lungs/Pleura: Dense subpleural airspace consolidation of the anterior left upper lobe noted extending from the anterior hilum and abutting the subpleural lung anteriorly and medially bordering the anterior mediastinum. There are some small pockets of cavitation within the consolidated lung and findings are consistent with acute pneumonia. Given the evidence potential prior granulomatous disease, active tuberculosis should be excluded. Upper Abdomen: Calcified gallstones identified in the gallbladder lumen. Musculoskeletal: No chest wall mass or suspicious bone lesions identified. IMPRESSION: Dense airspace consolidation of the anterior left upper lobe with small areas of internal cavitation. Findings are consistent with acute pneumonia. Associated numerous high density calcified lymph nodes throughout the mediastinum and in both hilar regions. These nodes are suggestive of either prior granulomatous infection or other treated disease. Active tuberculosis should be excluded based on the presence of these high density lymph nodes and the pneumonic consolidation present. Electronically Signed   By: Aletta Edouard M.D.   On: 10/09/2017  18:12   Recent Results (from the  past 240 hour(s))  Blood culture (routine x 2)     Status: None (Preliminary result)   Collection Time: 10/09/17  8:15 PM  Result Value Ref Range Status   Specimen Description BLOOD LEFT ANTECUBITAL  Final   Special Requests   Final    BOTTLES DRAWN AEROBIC AND ANAEROBIC Blood Culture adequate volume   Culture NO GROWTH < 24 HOURS  Final   Report Status PENDING  Incomplete  Blood culture (routine x 2)     Status: None (Preliminary result)   Collection Time: 10/09/17  8:47 PM  Result Value Ref Range Status   Specimen Description BLOOD LEFT ARM  Final   Special Requests   Final    BLOOD AEROBIC BOTTLE Blood Culture results may not be optimal due to an inadequate volume of blood received in culture bottles   Culture NO GROWTH < 24 HOURS  Final   Report Status PENDING  Incomplete    Microbiology: Recent Results (from the past 240 hour(s))  Blood culture (routine x 2)     Status: None (Preliminary result)   Collection Time: 10/09/17  8:15 PM  Result Value Ref Range Status   Specimen Description BLOOD LEFT ANTECUBITAL  Final   Special Requests   Final    BOTTLES DRAWN AEROBIC AND ANAEROBIC Blood Culture adequate volume   Culture NO GROWTH < 24 HOURS  Final   Report Status PENDING  Incomplete  Blood culture (routine x 2)     Status: None (Preliminary result)   Collection Time: 10/09/17  8:47 PM  Result Value Ref Range Status   Specimen Description BLOOD LEFT ARM  Final   Special Requests   Final    BLOOD AEROBIC BOTTLE Blood Culture results may not be optimal due to an inadequate volume of blood received in culture bottles   Culture NO GROWTH < 24 HOURS  Final   Report Status PENDING  Incomplete    Radiographs and labs were personally reviewed by me.   Bobby Rumpf, MD [facility] [phone] 10/10/2017, 5:56 PM

## 2017-10-10 NOTE — Progress Notes (Signed)
Received pt per stretcher. Pt is alert and oriented x4. Pt ambulated to bed. Oriented pt to room. Pt denies pain nor SOB. Vital signs within normal limits. Food and water provided to pt.

## 2017-10-10 NOTE — Progress Notes (Signed)
PROGRESS NOTE   Jason Clark  WUJ:[id number]    DOB: 03/15/1956    DOA: 10/09/2017  PCP: Rometta Emery, MD   I have briefly reviewed patients previous medical records in [facility].  Brief Narrative:  61 year old male, originally from [place], speaks [place] and limited [place], with no known significant PMH presented to ED with one-week history of left sided chest pain, worse with inspiration, cough with intermittent mild blood and sputum, no dyspnea or fever or chills. Reports normal appetite and no weight loss. CT chest showed LUL dense airspace consolidation with internal cavitation and hilar lymph node. Admitted for cavitating pneumonia, rule out TB. ID consulted by EDP, input pending.   Assessment & Plan:   Principal Problem:   Pulmonary cavitary lesion Active Problems:   Community acquired pneumonia of left upper lobe of lung ([facility])   1. Left upper lobe cavitary pneumonia: R/O pulmonary TB versus other etiologies. Patient reports that he's been in this country since 2002 and has not gone back to [place]. Denies personal or family history of TB. Nonsmoker. Check sputum AFB. HIV screen negative. Blood cultures 2: Negative to date. Empirically treating for community-acquired pneumonia with ceftriaxone and azithromycin. ID consulted by EDP last night, input pending.    DVT prophylaxis: SCDs Code Status: Full Family Communication: None at bedside Disposition: DC home when medically improved   Consultants:  Infectious disease   Procedures:  None  Antimicrobials:  IV ceftriaxone and azithromycin    Subjective: Attempted to interview patient using video interpreter but patient insisted on speaking to me in English and had to turn off the medial interpreter. Reports that he was in his usual state of health until Friday prior to admission then started noticing left-sided chest pain which progressively got worse over the next 48 hours limiting his sleep, intermittent mild  hemoptysis but no dyspnea. Denies night sweats, fever, chills, decreased appetite or loss of weight.   ROS: As above  Objective:  Vitals:   10/09/17 2330 10/10/17 0029 10/10/17 0517 10/10/17 1307  BP: 137/79 (!) 148/83 123/75 117/71  Pulse: 99 (!) 102 92 98  Resp: 16 19 18    Temp:  99.3 F (37.4 C) 98.1 F (36.7 C) 98.6 F (37 C)  TempSrc:  Oral Oral Oral  SpO2: 98% 99% 99% 100%  Weight:      Height:  5\' 8"  (1.727 m)      Examination:  General exam: Pleasant middle-aged male, moderately built and nourished, sitting up comfortably in bed. Does not look septic or toxic. Lymphatics: No generalized lymphadenopathy. Respiratory system: Reduced breath sounds left upper lung fields. Rest of lung fields clear to auscultation. Respiratory effort normal. Cardiovascular system: S1 & S2 heard, RRR. No JVD, murmurs, rubs, gallops or clicks. No pedal edema. Telemetry: Sinus rhythm. Gastrointestinal system: Abdomen is nondistended, soft and nontender. No organomegaly or masses felt. Normal bowel sounds heard. Central nervous system: Alert and oriented. No focal neurological deficits. Extremities: Symmetric 5 x 5 power. Skin: No rashes, lesions or ulcers Psychiatry: Judgement and insight appear normal. Mood & affect appropriate.     Data Reviewed: I have personally reviewed following labs and imaging studies  CBC: Recent Labs  Lab 10/09/17 1333  WBC 6.6  HGB 12.8*  HCT 37.3*  MCV 86.3  PLT 206   Basic Metabolic Panel: Recent Labs  Lab 10/09/17 1333  NA 136  K 3.9  CL 99*  CO2 27  GLUCOSE 104*  BUN 10  CREATININE 0.88  CALCIUM 9.2     Recent Results (from the past 240 hour(s))  Blood culture (routine x 2)     Status: None (Preliminary result)   Collection Time: 10/09/17  8:15 PM  Result Value Ref Range Status   Specimen Description BLOOD LEFT ANTECUBITAL  Final   Special Requests   Final    BOTTLES DRAWN AEROBIC AND ANAEROBIC Blood Culture adequate volume   Culture  NO GROWTH < 24 HOURS  Final   Report Status PENDING  Incomplete  Blood culture (routine x 2)     Status: None (Preliminary result)   Collection Time: 10/09/17  8:47 PM  Result Value Ref Range Status   Specimen Description BLOOD LEFT ARM  Final   Special Requests   Final    BLOOD AEROBIC BOTTLE Blood Culture results may not be optimal due to an inadequate volume of blood received in culture bottles   Culture NO GROWTH < 24 HOURS  Final   Report Status PENDING  Incomplete         Radiology Studies: Dg Chest 2 View  Result Date: 10/09/2017 CLINICAL DATA:  Chest pain since friday EXAM: CHEST  2 VIEW COMPARISON:  Radiograph 11 10/2004. FINDINGS: Normal cardiac silhouette. There is subtle asymmetric LEFT suprahilar angular density not clearly seen on comparison exam. No consolidation. No pleural fluid or pneumothorax. No acute osseous abnormality. IMPRESSION: Subtle asymmetric LEFT suprahilar density. Recommend CT thorax for further evaluation Electronically Signed   By: Genevive BiStewart  Edmunds M.D.   On: 10/09/2017 14:00   Ct Chest Wo Contrast  Result Date: 10/09/2017 CLINICAL DATA:  Left-sided chest pain and shortness of breath. Abnormal chest x-ray demonstrating suprahilar density in the left chest. EXAM: CT CHEST WITHOUT CONTRAST TECHNIQUE: Multidetector CT imaging of the chest was performed following the standard protocol without IV contrast. COMPARISON:  Chest x-ray earlier today. FINDINGS: Cardiovascular: Normal heart size. No pericardial fluid. Normal caliber thoracic aorta and central pulmonary arteries. No evidence of calcified coronary artery plaque. Mediastinum/Nodes: Numerous small high density calcified lymph nodes are identified throughout the mediastinum and in both hilar regions. Findings are suggestive of either prior granulomatous infection or other treated disease. No enlarged lymph nodes are identified. Lungs/Pleura: Dense subpleural airspace consolidation of the anterior left upper  lobe noted extending from the anterior hilum and abutting the subpleural lung anteriorly and medially bordering the anterior mediastinum. There are some small pockets of cavitation within the consolidated lung and findings are consistent with acute pneumonia. Given the evidence potential prior granulomatous disease, active tuberculosis should be excluded. Upper Abdomen: Calcified gallstones identified in the gallbladder lumen. Musculoskeletal: No chest wall mass or suspicious bone lesions identified. IMPRESSION: Dense airspace consolidation of the anterior left upper lobe with small areas of internal cavitation. Findings are consistent with acute pneumonia. Associated numerous high density calcified lymph nodes throughout the mediastinum and in both hilar regions. These nodes are suggestive of either prior granulomatous infection or other treated disease. Active tuberculosis should be excluded based on the presence of these high density lymph nodes and the pneumonic consolidation present. Electronically Signed   By: Irish LackGlenn  Yamagata M.D.   On: 10/09/2017 18:12        Scheduled Meds: . azithromycin  500 mg Oral Q24H   Continuous Infusions: . cefTRIAXone (ROCEPHIN)  IV Stopped (10/09/17 2141)     LOS: 1 day     Marcellus ScottAnand Zenovia Justman, MD, FACP, [facility]. [facility] Pager [phone]  If 7PM-7AM, please contact night-coverage [email or web address] Password [email or web address] 10/10/2017,  4:59 PM

## 2017-10-11 MED ORDER — GUAIFENESIN-DM 100-10 MG/5ML PO SYRP
5.0000 mL | ORAL_SOLUTION | ORAL | Status: DC | PRN
  Administered 2017-10-11 – 2017-10-16 (×8): 5 mL via ORAL
  Filled 2017-10-11 (×8): qty 5

## 2017-10-11 MED ORDER — ACETAMINOPHEN 325 MG PO TABS
650.0000 mg | ORAL_TABLET | Freq: Four times a day (QID) | ORAL | Status: DC | PRN
  Administered 2017-10-11 – 2017-10-14 (×6): 650 mg via ORAL
  Filled 2017-10-11 (×6): qty 2

## 2017-10-11 NOTE — Progress Notes (Signed)
PROGRESS NOTE   Jason Clark  [id number]    DOB: 1955-12-21    DOA: 10/09/2017  PCP: Elwyn Reach, MD   I have briefly reviewed patients previous medical records in [facility].  Brief Narrative:  61 year old male, originally from [place], speaks [place] and limited [place], with no known significant PMH presented to ED with one-week history of left sided chest pain, worse with inspiration, cough with intermittent mild blood and sputum, no dyspnea or fever or chills. Reports normal appetite and no weight loss. CT chest showed LUL dense airspace consolidation with internal cavitation and hilar lymph node. Admitted for cavitating pneumonia, rule out TB. ID consulted.   Assessment & Plan:   Principal Problem:   Pulmonary cavitary lesion Active Problems:   Community acquired pneumonia of left upper lobe of lung ([place])   1. Left upper lobe cavitary pneumonia: R/O pulmonary TB versus other etiologies. Patient reports that he's been in this country since 2002 and has not gone back to [place]. Denies personal or family history of TB. Nonsmoker. Check sputum AFB (1 specimen sent and results pending). HIV screen negative. Blood cultures 2: Negative to date. Empirically treating for community-acquired pneumonia with ceftriaxone and azithromycin. ID input appreciated, high risk for TB. If sputum AFB negative then recommend considering BAL.  DVT prophylaxis: SCDs Code Status: Full Family Communication: Discussed in detail with patient's spouse at bedside. Patient anxious for discharge so he can can return to work. Explained to both of them that patient should not leave without completing evaluation & treatment, otherwise risk of worsening. They verbalized understanding. Disposition: DC home when medically improved, not until sometime next week.   Consultants:  Infectious disease   Procedures:  None  Antimicrobials:  IV ceftriaxone and azithromycin    Subjective: Spouse at bedside speaks  [place]. Patient denies chest pain since yesterday. Still has mild intermittent cough with white sputum but no blood.  ROS: As above  Objective:  Vitals:   10/10/17 1307 10/10/17 2152 10/11/17 0544 10/11/17 1352  BP: 117/71 128/80 128/70 131/75  Pulse: 98 100 (!) 108 (!) 112  Resp:   16 17  Temp: 98.6 F (37 C) 98.8 F (37.1 C) 98.2 F (36.8 C) 97.7 F (36.5 C)  TempSrc: Oral Oral Oral Oral  SpO2: 100% 100% 99% 100%  Weight:      Height:        Examination:  General exam: Pleasant middle-aged male, moderately built and nourished, sitting up comfortably in chair. Lymphatics: No generalized lymphadenopathy. Respiratory system: Reduced breath sounds left upper lung fields. Rest of lung fields clear to auscultation. Respiratory effort normal. No change/stable. Cardiovascular system: S1 & S2 heard, RRR. No JVD, murmurs, rubs, gallops or clicks. No pedal edema. Stable without change. Gastrointestinal system: Abdomen is nondistended, soft and nontender. No organomegaly or masses felt. Normal bowel sounds heard. Central nervous system: Alert and oriented. No focal neurological deficits. Extremities: Symmetric 5 x 5 power. Skin: No rashes, lesions or ulcers Psychiatry: Judgement and insight appear normal. Mood & affect appropriate.     Data Reviewed: I have personally reviewed following labs and imaging studies  CBC: Recent Labs  Lab 10/09/17 1333  WBC 6.6  HGB 12.8*  HCT 37.3*  MCV 86.3  PLT 016   Basic Metabolic Panel: Recent Labs  Lab 10/09/17 1333  NA 136  K 3.9  CL 99*  CO2 27  GLUCOSE 104*  BUN 10  CREATININE 0.88  CALCIUM 9.2     Recent  Results (from the past 240 hour(s))  Blood culture (routine x 2)     Status: None (Preliminary result)   Collection Time: 10/09/17  8:15 PM  Result Value Ref Range Status   Specimen Description BLOOD LEFT ANTECUBITAL  Final   Special Requests   Final    BOTTLES DRAWN AEROBIC AND ANAEROBIC Blood Culture adequate volume    Culture NO GROWTH 2 DAYS  Final   Report Status PENDING  Incomplete  Blood culture (routine x 2)     Status: None (Preliminary result)   Collection Time: 10/09/17  8:47 PM  Result Value Ref Range Status   Specimen Description BLOOD LEFT ARM  Final   Special Requests   Final    BLOOD AEROBIC BOTTLE Blood Culture results may not be optimal due to an inadequate volume of blood received in culture bottles   Culture NO GROWTH 2 DAYS  Final   Report Status PENDING  Incomplete         Radiology Studies: Ct Chest Wo Contrast  Result Date: 10/09/2017 CLINICAL DATA:  Left-sided chest pain and shortness of breath. Abnormal chest x-ray demonstrating suprahilar density in the left chest. EXAM: CT CHEST WITHOUT CONTRAST TECHNIQUE: Multidetector CT imaging of the chest was performed following the standard protocol without IV contrast. COMPARISON:  Chest x-ray earlier today. FINDINGS: Cardiovascular: Normal heart size. No pericardial fluid. Normal caliber thoracic aorta and central pulmonary arteries. No evidence of calcified coronary artery plaque. Mediastinum/Nodes: Numerous small high density calcified lymph nodes are identified throughout the mediastinum and in both hilar regions. Findings are suggestive of either prior granulomatous infection or other treated disease. No enlarged lymph nodes are identified. Lungs/Pleura: Dense subpleural airspace consolidation of the anterior left upper lobe noted extending from the anterior hilum and abutting the subpleural lung anteriorly and medially bordering the anterior mediastinum. There are some small pockets of cavitation within the consolidated lung and findings are consistent with acute pneumonia. Given the evidence potential prior granulomatous disease, active tuberculosis should be excluded. Upper Abdomen: Calcified gallstones identified in the gallbladder lumen. Musculoskeletal: No chest wall mass or suspicious bone lesions identified. IMPRESSION: Dense  airspace consolidation of the anterior left upper lobe with small areas of internal cavitation. Findings are consistent with acute pneumonia. Associated numerous high density calcified lymph nodes throughout the mediastinum and in both hilar regions. These nodes are suggestive of either prior granulomatous infection or other treated disease. Active tuberculosis should be excluded based on the presence of these high density lymph nodes and the pneumonic consolidation present. Electronically Signed   By: Aletta Edouard M.D.   On: 10/09/2017 18:12        Scheduled Meds: . azithromycin  500 mg Oral Q24H   Continuous Infusions: . cefTRIAXone (ROCEPHIN)  IV Stopped (10/10/17 2053)     LOS: 2 days     Vernell Leep, MD, FACP, [facility]. [facility] Pager [phone]  If 7PM-7AM, please contact night-coverage [email or web address] Password [facility] 10/11/2017, 5:48 PM

## 2017-10-11 NOTE — Consult Note (Signed)
INFECTIOUS DISEASE PROGRESS NOTE  ID: Jason Clark is a 61 y.o. male with  Principal Problem:   Pulmonary cavitary lesion Active Problems:   Community acquired pneumonia of left upper lobe of lung ([place])  Subjective: No complaints Denies CP  Abtx:  Anti-infectives (From admission, onward)   Start     Dose/Rate Route Frequency Ordered Stop   10/10/17 2000  azithromycin (ZITHROMAX) tablet 500 mg     500 mg Oral Every 24 hours 10/09/17 2023 10/17/17 1959   10/09/17 2100  cefTRIAXone (ROCEPHIN) 1 g in dextrose 5 % 50 mL IVPB     1 g 100 mL/hr over 30 Minutes Intravenous Every 24 hours 10/09/17 2023 10/16/17 2059   10/09/17 1945  cefTRIAXone (ROCEPHIN) 1 g in dextrose 5 % 50 mL IVPB  Status:  Discontinued     1 g 100 mL/hr over 30 Minutes Intravenous  Once 10/09/17 1939 10/09/17 2026   10/09/17 1945  azithromycin (ZITHROMAX) tablet 500 mg     500 mg Oral  Once 10/09/17 1939 10/09/17 2016      Medications:  Scheduled: . azithromycin  500 mg Oral Q24H    Objective: Vital signs in last 24 hours: Temp:  [98.2 F (36.8 C)-98.8 F (37.1 C)] 98.2 F (36.8 C) (12/14 0544) Pulse Rate:  [98-108] 108 (12/14 0544) Resp:  [16] 16 (12/14 0544) BP: (117-128)/(70-80) 128/70 (12/14 0544) SpO2:  [99 %-100 %] 99 % (12/14 0544)   General appearance: alert and no distress Resp: clear to auscultation bilaterally Cardio: regular rate and rhythm GI: normal findings: bowel sounds normal and soft, non-tender  Lab Results Recent Labs    10/09/17 1333  WBC 6.6  HGB 12.8*  HCT 37.3*  NA 136  K 3.9  CL 99*  CO2 27  BUN 10  CREATININE 0.88   Liver Panel No results for input(s): PROT, ALBUMIN, AST, ALT, ALKPHOS, BILITOT, BILIDIR, IBILI in the last 72 hours. Sedimentation Rate No results for input(s): ESRSEDRATE in the last 72 hours. C-Reactive Protein No results for input(s): CRP in the last 72 hours.  Microbiology: Recent Results (from the past 240 hour(s))  Blood culture (routine  x 2)     Status: None (Preliminary result)   Collection Time: 10/09/17  8:15 PM  Result Value Ref Range Status   Specimen Description BLOOD LEFT ANTECUBITAL  Final   Special Requests   Final    BOTTLES DRAWN AEROBIC AND ANAEROBIC Blood Culture adequate volume   Culture NO GROWTH 2 DAYS  Final   Report Status PENDING  Incomplete  Blood culture (routine x 2)     Status: None (Preliminary result)   Collection Time: 10/09/17  8:47 PM  Result Value Ref Range Status   Specimen Description BLOOD LEFT ARM  Final   Special Requests   Final    BLOOD AEROBIC BOTTLE Blood Culture results may not be optimal due to an inadequate volume of blood received in culture bottles   Culture NO GROWTH 2 DAYS  Final   Report Status PENDING  Incomplete    Studies/Results: Dg Chest 2 View  Result Date: 10/09/2017 CLINICAL DATA:  Chest pain since friday EXAM: CHEST  2 VIEW COMPARISON:  Radiograph 11 10/2004. FINDINGS: Normal cardiac silhouette. There is subtle asymmetric LEFT suprahilar angular density not clearly seen on comparison exam. No consolidation. No pleural fluid or pneumothorax. No acute osseous abnormality. IMPRESSION: Subtle asymmetric LEFT suprahilar density. Recommend CT thorax for further evaluation Electronically Signed   By: Nicole Kindred  Leonia Reeves M.D.   On: 10/09/2017 14:00   Ct Chest Wo Contrast  Result Date: 10/09/2017 CLINICAL DATA:  Left-sided chest pain and shortness of breath. Abnormal chest x-ray demonstrating suprahilar density in the left chest. EXAM: CT CHEST WITHOUT CONTRAST TECHNIQUE: Multidetector CT imaging of the chest was performed following the standard protocol without IV contrast. COMPARISON:  Chest x-ray earlier today. FINDINGS: Cardiovascular: Normal heart size. No pericardial fluid. Normal caliber thoracic aorta and central pulmonary arteries. No evidence of calcified coronary artery plaque. Mediastinum/Nodes: Numerous small high density calcified lymph nodes are identified  throughout the mediastinum and in both hilar regions. Findings are suggestive of either prior granulomatous infection or other treated disease. No enlarged lymph nodes are identified. Lungs/Pleura: Dense subpleural airspace consolidation of the anterior left upper lobe noted extending from the anterior hilum and abutting the subpleural lung anteriorly and medially bordering the anterior mediastinum. There are some small pockets of cavitation within the consolidated lung and findings are consistent with acute pneumonia. Given the evidence potential prior granulomatous disease, active tuberculosis should be excluded. Upper Abdomen: Calcified gallstones identified in the gallbladder lumen. Musculoskeletal: No chest wall mass or suspicious bone lesions identified. IMPRESSION: Dense airspace consolidation of the anterior left upper lobe with small areas of internal cavitation. Findings are consistent with acute pneumonia. Associated numerous high density calcified lymph nodes throughout the mediastinum and in both hilar regions. These nodes are suggestive of either prior granulomatous infection or other treated disease. Active tuberculosis should be excluded based on the presence of these high density lymph nodes and the pneumonic consolidation present. Electronically Signed   By: Aletta Edouard M.D.   On: 10/09/2017 18:12     Assessment/Plan: Cavitary Pneumonia  Total days of antibiotics: 2 ceftriaxone, azithromycin  He has 1 AFB pending.  Needs at least 1 more.  I would consider BAL if these are negative.  I believe he is high risk for TB  ID available as needed over the weekend.          Bobby Rumpf MD, FACP Infectious Diseases (pager) [phone] [email or web address] 10/11/2017, 12:22 PM  LOS: 2 days

## 2017-10-12 LAB — EXPECTORATED SPUTUM ASSESSMENT W REFEX TO RESP CULTURE

## 2017-10-12 LAB — ACID FAST SMEAR (AFB, MYCOBACTERIA): Acid Fast Smear: NEGATIVE

## 2017-10-12 LAB — TSH: TSH: 0.325 u[IU]/mL — AB (ref 0.350–4.500)

## 2017-10-12 LAB — EXPECTORATED SPUTUM ASSESSMENT W GRAM STAIN, RFLX TO RESP C

## 2017-10-12 NOTE — Progress Notes (Signed)
PROGRESS NOTE   Jason Clark  VVO:[id number]    DOB: July 12, 1956    DOA: 10/09/2017  PCP: Elwyn Reach, MD   I have briefly reviewed patients previous medical records in [facility].  Brief Narrative:  61 year old male, originally from [place], speaks [place] and limited [place], with no known significant PMH presented to ED with one-week history of left sided chest pain, worse with inspiration, cough with intermittent mild blood and sputum, no dyspnea or fever or chills. Reports normal appetite and no weight loss. CT chest showed LUL dense airspace consolidation with internal cavitation and hilar lymph node. Admitted for cavitating pneumonia, rule out TB. ID consulted.   Assessment & Plan:   Principal Problem:   Pulmonary cavitary lesion Active Problems:   Community acquired pneumonia of left upper lobe of lung ([place])   1. Left upper lobe cavitary pneumonia: R/O pulmonary TB versus other etiologies. Patient reports that he's been in this country since 2002 and has not gone back to [place]. Denies personal or family history of TB. Nonsmoker. Check sputum AFB (3 specimens sent and results pending). HIV screen negative. Blood cultures 2: Negative to date. Empirically treating for community-acquired pneumonia with ceftriaxone and azithromycin. ID input appreciated, high risk for TB. If sputum AFB negative then recommend considering BAL. Patient getting restless and eager for discharge. Discussed extensively regarding importance of remaining in hospital until complete evaluation and treatment is complete. Patient agreeable at this time. I also called and discussed with patient's PCP who will call and speak with patient. 2. Sinus tachycardia, mild: Clinically euthyroid and euvolemic. Check TSH.  DVT prophylaxis: SCDs Code Status: Full Family Communication: None at bedside. Disposition: DC home when medically improved, not until sometime next week.   Consultants:  Infectious disease    Procedures:  None  Antimicrobials:  IV ceftriaxone and azithromycin    Subjective: No chest pain. Intermittent cough with mild white sputum. No blood. Reports feeling "hot and cold". Reports some nasal stuffiness and indicates that uses some inhaler at home, cannot recollect name.  ROS: As above  Objective:  Vitals:   10/11/17 1352 10/11/17 2015 10/12/17 0631 10/12/17 1331  BP: 131/75 140/77 117/71 126/76  Pulse: (!) 112 (!) 118 (!) 119 (!) 118  Resp: _0 Temp: 97.7 F (36.5 C) 99.1 F (37.3 C) 99.3 F (37.4 C) 99 F (37.2 C)  TempSrc: Oral Oral Oral Oral  SpO2: 100% 98% 98% 98%  Weight:      Height:        Examination:  General exam: Pleasant middle-aged male, moderately built and nourished, sitting up comfortably in chair. Stable Lymphatics: No generalized lymphadenopathy. Respiratory system: Few crackles in left upper lung fields anteriorly. Rest of lung fields clear to auscultation. No increased work of breathing. Cardiovascular system: S1 & S2 heard, RRR. No JVD, murmurs, rubs, gallops or clicks. No pedal edema. Stable without change. Gastrointestinal system: Abdomen is nondistended, soft and nontender. No organomegaly or masses felt. Normal bowel sounds heard. Central nervous system: Alert and oriented. No focal neurological deficits. Extremities: Symmetric 5 x 5 power. Skin: No rashes, lesions or ulcers Psychiatry: Judgement and insight appear normal. Mood & affect appropriate.     Data Reviewed: I have personally reviewed following labs and imaging studies  CBC: Recent Labs  Lab 10/09/17 1333  WBC 6.6  HGB 12.8*  HCT 37.3*  MCV 86.3  PLT 269   Basic Metabolic Panel: Recent Labs  Lab 10/09/17 1333  NA  136  K 3.9  CL 99*  CO2 27  GLUCOSE 104*  BUN 10  CREATININE 0.88  CALCIUM 9.2     Recent Results (from the past 240 hour(s))  Blood culture (routine x 2)     Status: None (Preliminary result)   Collection Time: 10/09/17  8:15  PM  Result Value Ref Range Status   Specimen Description BLOOD LEFT ANTECUBITAL  Final   Special Requests   Final    BOTTLES DRAWN AEROBIC AND ANAEROBIC Blood Culture adequate volume   Culture NO GROWTH 2 DAYS  Final   Report Status PENDING  Incomplete  Blood culture (routine x 2)     Status: None (Preliminary result)   Collection Time: 10/09/17  8:47 PM  Result Value Ref Range Status   Specimen Description BLOOD LEFT ARM  Final   Special Requests   Final    BLOOD AEROBIC BOTTLE Blood Culture results may not be optimal due to an inadequate volume of blood received in culture bottles   Culture NO GROWTH 2 DAYS  Final   Report Status PENDING  Incomplete         Radiology Studies: No results found.      Scheduled Meds: . azithromycin  500 mg Oral Q24H   Continuous Infusions: . cefTRIAXone (ROCEPHIN)  IV Stopped (10/11/17 2110)     LOS: 3 days     Vernell Leep, MD, FACP, [facility]. [facility] Pager [phone]  If 7PM-7AM, please contact night-coverage [email or web address] Password [email or web address] 10/12/2017, 3:10 PM

## 2017-10-13 LAB — ACID FAST SMEAR (AFB, MYCOBACTERIA): Acid Fast Smear: NEGATIVE

## 2017-10-13 LAB — ACID FAST SMEAR (AFB)

## 2017-10-13 LAB — T4, FREE: FREE T4: 0.94 ng/dL (ref 0.61–1.12)

## 2017-10-13 NOTE — Progress Notes (Signed)
PROGRESS NOTE   Jason Clark  [id number]    DOB: 07-Mar-1956    DOA: 10/09/2017  PCP: Elwyn Reach, MD   I have briefly reviewed patients previous medical records in [facility].  Brief Narrative:  61 year old male, originally from [place], speaks [place] and limited [place], with no known significant PMH presented to ED with one-week history of left sided chest pain, worse with inspiration, cough with intermittent mild blood and sputum, no dyspnea or fever or chills. Reports normal appetite and no weight loss. CT chest showed LUL dense airspace consolidation with internal cavitation and hilar lymph node. Admitted for cavitating pneumonia, rule out TB. ID consulted.   Assessment & Plan:   Principal Problem:   Pulmonary cavitary lesion Active Problems:   Community acquired pneumonia of left upper lobe of lung ([place])   1. Left upper lobe cavitary pneumonia: R/O pulmonary TB versus other etiologies. Patient reports that he's been in this country since 2002 and has not gone back to [place]. Denies personal or family history of TB. Nonsmoker. Check sputum AFB (1/3 AFB smear negative). HIV screen negative. Blood cultures 2: Negative to date. Empirically treating for community-acquired pneumonia with ceftriaxone and azithromycin. ID input appreciated, high risk for TB. If sputum AFB negative then recommend considering BAL. Patient's PCP today discussed extensively with patient via phone while I was in the room and patient is agreeable to stay in the hospital until evaluation and treatment has been completed. Patient also expressed concern that he has mold at home which may have caused his infection, ID informed. 2. Sinus tachycardia, mild: Clinically euthyroid and euvolemic. TSH mildly low at 0.325. Check free T4 and free T3.  DVT prophylaxis: SCDs Code Status: Full Family Communication: None at bedside. Disposition: DC home when medically improved, not until sometime next  week.   Consultants:  Infectious disease   Procedures:  None  Antimicrobials:  IV ceftriaxone and azithromycin    Subjective: No chest pain, has minimal mostly dry cough. No fever or chills.  ROS: As above  Objective:  Vitals:   10/12/17 0631 10/12/17 1331 10/12/17 2145 10/13/17 0620  BP: 117/71 126/76 132/78 110/70  Pulse: (!) 119 (!) 118 (!) 106 95  Resp: _0 Temp: 99.3 F (37.4 C) 99 F (37.2 C) 99.3 F (37.4 C) 98.4 F (36.9 C)  TempSrc: Oral Oral Oral Oral  SpO2: 98% 98% 100% 100%  Weight:      Height:        Examination:  General exam: Pleasant middle-aged male, moderately built and nourished, sitting up comfortably in chair. Has not appeared in any distress since admission. Lymphatics: No generalized lymphadenopathy. Respiratory system: Few crackles in left upper lung fields anteriorly. Rest of lung fields clear to auscultation. No increased work of breathing. Stable without change. Cardiovascular system: S1 & S2 heard, RRR. No JVD, murmurs, rubs, gallops or clicks. No pedal edema. Stable without change. Gastrointestinal system: Abdomen is nondistended, soft and nontender. No organomegaly or masses felt. Normal bowel sounds heard. Central nervous system: Alert and oriented. No focal neurological deficits. Extremities: Symmetric 5 x 5 power. Skin: No rashes, lesions or ulcers Psychiatry: Judgement and insight appear normal. Mood & affect appropriate.     Data Reviewed: I have personally reviewed following labs and imaging studies  CBC: Recent Labs  Lab 10/09/17 1333  WBC 6.6  HGB 12.8*  HCT 37.3*  MCV 86.3  PLT 774   Basic Metabolic Panel: Recent Labs  Lab  10/09/17 1333  NA 136  K 3.9  CL 99*  CO2 27  GLUCOSE 104*  BUN 10  CREATININE 0.88  CALCIUM 9.2     Recent Results (from the past 240 hour(s))  Blood culture (routine x 2)     Status: None (Preliminary result)   Collection Time: 10/09/17  8:15 PM  Result Value Ref Range  Status   Specimen Description BLOOD LEFT ANTECUBITAL  Final   Special Requests   Final    BOTTLES DRAWN AEROBIC AND ANAEROBIC Blood Culture adequate volume   Culture NO GROWTH 4 DAYS  Final   Report Status PENDING  Incomplete  Blood culture (routine x 2)     Status: None (Preliminary result)   Collection Time: 10/09/17  8:47 PM  Result Value Ref Range Status   Specimen Description BLOOD LEFT ARM  Final   Special Requests   Final    BLOOD AEROBIC BOTTLE Blood Culture results may not be optimal due to an inadequate volume of blood received in culture bottles   Culture NO GROWTH 4 DAYS  Final   Report Status PENDING  Incomplete  Acid Fast Smear (AFB)     Status: None   Collection Time: 10/11/17  5:00 AM  Result Value Ref Range Status   AFB Specimen Processing Concentration  Final   Acid Fast Smear Negative  Final    Comment: (NOTE) Performed At: [facility] [address] [place], [place] [id number] Rush Farmer MD EH:[id number]    Source (AFB) SPUTUM  Final  Culture, sputum-assessment     Status: None   Collection Time: 10/12/17 12:27 PM  Result Value Ref Range Status   Specimen Description EXPECTORATED SPUTUM  Final   Special Requests NONE  Final   Sputum evaluation THIS SPECIMEN IS ACCEPTABLE FOR SPUTUM CULTURE  Final   Report Status 10/12/2017 FINAL  Final  Culture, respiratory (NON-Expectorated)     Status: None (Preliminary result)   Collection Time: 10/12/17 12:27 PM  Result Value Ref Range Status   Specimen Description EXPECTORATED SPUTUM  Final   Special Requests NONE Reflexed from [id number]  Final   Gram Stain   Final    RARE SQUAMOUS EPITHELIAL CELLS PRESENT MODERATE WBC PRESENT, PREDOMINANTLY PMN ABUNDANT GRAM POSITIVE COCCI IN CHAINS    Culture TOO YOUNG TO READ  Final   Report Status PENDING  Incomplete         Radiology Studies: No results found.      Scheduled Meds: . azithromycin  500 mg Oral Q24H   Continuous Infusions: . cefTRIAXone  (ROCEPHIN)  IV Stopped (10/12/17 2022)     LOS: 4 days     Vernell Leep, MD, FACP, [facility]. [facility] Pager [phone]  If 7PM-7AM, please contact night-coverage [email or web address] Password [email or web address] 10/13/2017, 2:42 PM

## 2017-10-14 DIAGNOSIS — R590 Localized enlarged lymph nodes: Secondary | ICD-10-CM

## 2017-10-14 DIAGNOSIS — R0902 Hypoxemia: Secondary | ICD-10-CM

## 2017-10-14 LAB — CULTURE, BLOOD (ROUTINE X 2)
Culture: NO GROWTH
Culture: NO GROWTH
SPECIAL REQUESTS: ADEQUATE

## 2017-10-14 LAB — T3, FREE: T3 FREE: 1.9 pg/mL — AB (ref 2.0–4.4)

## 2017-10-14 NOTE — Consult Note (Signed)
Name: Jason Clark MRN: [id number] DOB: 02/27/1956    ADMISSION DATE:  10/09/2017 CONSULTATION DATE: 12/17  REFERRING MD :  Waymon AmatoHongalgi   CHIEF COMPLAINT:  Cavitary PNA   BRIEF PATIENT DESCRIPTION:  61 year old male originally from [place]. Admitted 12/12 w/ hemoptysis and LUL cavitary PNA. Initial AFBs negative. PCCM asked to see by ID for possible FOB.    SIGNIFICANT EVENTS    STUDIES:  CT chest 12/17: Dense airspace consolidation of the anterior left upper lobe with small areas of internal cavitation. Findings are consistent with acute pneumonia. Associated numerous high density calcified lymph nodes throughout the mediastinum and in both hilar regions. These nodes are suggestive of either prior granulomatous infection or other treated disease. Active tuberculosis should be excluded based on the presence of these high density lymph nodes and the pneumonic consolidation present.   HISTORY OF PRESENT ILLNESS:   A 61 year old patient without any significant past medical history. Originally from [place] in early 2000s.  Presented to the emergency room on 12/12 with 3-week history of cough, and hemoptysis over 4 days.  CT chest was obtained demonstrating left upper lobe infiltrate with cavitation.  He was placed on respiratory isolation, AFB's were collected and he was placed on empiric antibiotics for community-acquired pneumonia.  He has been seen in consultation by infectious disease. Diagnostic data to date: 2 of 3 AFBs have resulted as negative, sputum culture demonstrating abundant gram-positive cocci in chains, still in preliminary evaluation, urine strep negative, rapid HIV nonreactive.  He is felt to be high risk for TB and if he is to date have been negative, because of this infectious disease has asked for pulmonary to see an effort to obtain bronchial alveolar lavage.  PAST MEDICAL HISTORY :   has a past medical history of CAP (community acquired pneumonia)  (10/09/2017) and Pulmonary cavitary lesion (10/09/2017).  has no past surgical history on file. Prior to Admission medications   Medication Sig Start Date End Date Taking? Authorizing Provider  DM-Doxylamine-Acetaminophen (NYQUIL COLD & FLU PO) Take 30 mLs by mouth at bedtime as needed (pain).   Yes [provider]  fluticasone (FLONASE) 50 MCG/ACT nasal spray Place 1 spray into both nostrils 2 (two) times daily. 09/17/17  Yes [provider]  hydrochlorothiazide (HYDRODIURIL) 12.5 MG tablet Take 12.5 mg by mouth daily. 09/10/17  Yes [provider]  ibuprofen (ADVIL) 200 MG tablet Take 200 mg by mouth every 6 (six) hours as needed. For pain   Yes [provider]  lisinopril (PRINIVIL,ZESTRIL) 20 MG tablet Take 20 mg by mouth daily. 09/17/17  Yes [provider]  omeprazole (PRILOSEC) 40 MG capsule Take 40 mg by mouth daily. 07/11/17  Yes [provider]  pantoprazole (PROTONIX) 40 MG tablet Take 1 tablet (40 mg total) by mouth daily. 08/31/12  Yes Dione BoozeGlick, David, MD  simvastatin (ZOCOR) 20 MG tablet Take 20 mg by mouth daily. 07/22/17  Yes [provider]  Vitamin D, Ergocalciferol, (DRISDOL) 50000 units CAPS capsule Take 50,000 Units by mouth once a week. 09/28/17  Yes [provider]   No Known Allergies  FAMILY HISTORY:  family history is not on file. SOCIAL HISTORY:  reports that  has never smoked. he has never used smokeless tobacco. He reports that he does not drink alcohol or use drugs.  REVIEW OF SYSTEMS:   Limited due to language deficit, however does still have hemoptysis  SUBJECTIVE:  Feels better VITAL SIGNS: Temp:  [  98.1 F (36.7 C)-98.2 F (36.8 C)] 98.1 F (36.7 C) (12/17 0530) Pulse Rate:  [70-91] 70 (12/17 0530) Resp:  [16-18] 16 (12/17 0530) BP: (115-127)/(67-74) 117/72 (12/17 0530) SpO2:  [100 %] 100 % (12/17 0530)  PHYSICAL EXAMINATION: General: Well-nourished 61 year old male patient speaks  very little English no acute distress Neuro: Awake oriented no focal deficits HEENT: Normocephalic atraumatic no jugular venous distention Cardiovascular: Regular rate and rhythm Lungs: Clear to auscultation no accessory use Abdomen: Soft nontender Musculoskeletal: Equal strength, bulk Skin: Warm and dry  Recent Labs  Lab 10/09/17 1333  NA 136  K 3.9  CL 99*  CO2 27  BUN 10  CREATININE 0.88  GLUCOSE 104*   Recent Labs  Lab 10/09/17 1333  HGB 12.8*  HCT 37.3*  WBC 6.6  PLT 206   No results found.  ASSESSMENT / PLAN:  Left upper Lobe cavitary PNA -2/3 afb negative to date -U strep neg -sputum still pending.  -agree very high risk for Tb but certainly necrotizing PNA on ddx as well.  Plan/rec Cont empiric abx (day # 6) F/u pending sputum culture; Will arrange FOB    Simonne MartinetPeter E Babcock ACNP-BC [facility] Pager # [phone] OR # [phone] if no answer  10/14/2017, 2:15 PM  Attending Note:  61 year old male recent immigrant from [place] who presents with fever, cough and cavitary pneumonia of the LUL on CT that I reviewed myself.  On exam, lungs are clear with no wheezing.  Discussed with Dr. Algis LimingVandam from ID and PCCM-NP.  CAP:             - Continue rocephin             - ID following             - F/u on cultures of the sputum sent to lab  TB:             - Plan on bronch in AM with AFB and cytology  Hemoptysis:             - Bronch in AM for inspection  Hypoxemia:             - Titrate O2 for sat of 92-95%, anticipate to be off prior to discharge.  Calcified lymph notes: highly suspicious for TB             - F/U imaging as needed.  PCCM will f/u in AM.  Patient seen and examined, agree with above note.  I dictated the care and orders written for this patient under my direction.  Alyson ReedyYacoub, Wesam G, MD [phone]

## 2017-10-14 NOTE — Progress Notes (Signed)
      INFECTIOUS DISEASE ATTENDING ADDENDUM:   Date: 10/14/2017  Patient name: Jason Clark  Medical record number: [id number]  Date of birth: 1956-05-01    Patient was in the bathroom when I came to examine him.  I discussed case with Dr. Nelda Marseille.  Patient has 2 sputa that are negative for AFB.  This is a situation where we are not "ruling out TB" in a low prob patient but trying to pin down the diagnosis in someone in whom are suspicion is very high.  If 3rd AFB is negative I would definitely want bronchscopy for BAL for AFB cultures, fungal cultures, cultures for Nocardia.  I am ordering an actual HIV test since rapid can be false -.     Rhina Brackett Dam 10/14/2017, 8:02 PM

## 2017-10-14 NOTE — Progress Notes (Signed)
PROGRESS NOTE   Jason Clark  IPJ:[id number]    DOB: September 25, 1956    DOA: 10/09/2017  PCP: Elwyn Reach, MD   I have briefly reviewed patients previous medical records in [facility].  Brief Narrative:  61 year old male, originally from [place], speaks [place] and limited [place], with no known significant PMH presented to ED with one-week history of left sided chest pain, worse with inspiration, cough with intermittent mild blood and sputum, no dyspnea or fever or chills. Reports normal appetite and no weight loss. CT chest showed LUL dense airspace consolidation with internal cavitation and hilar lymph node. Admitted for cavitating pneumonia, rule out TB. ID consulted.   Assessment & Plan:   Principal Problem:   Pulmonary cavitary lesion Active Problems:   Community acquired pneumonia of left upper lobe of lung ([place])   Hypoxemia   Mediastinal lymphadenopathy   1. Left upper lobe cavitary pneumonia: R/O pulmonary TB versus other etiologies. Patient reports that he's been in this country since 2002 and has not gone back to [place]. Denies personal or family history of TB. Nonsmoker. Check sputum AFB (2/3 AFB smear negative). HIV screen negative. Blood cultures 2: Negative to date. Empirically treating for community-acquired pneumonia with ceftriaxone and azithromycin. ID input appreciated, high risk for TB. If sputum AFB negative then recommend considering BAL. Patient also expressed concern that he has mold at home which may have caused his infection, ID & PCCM informed. I discussed with Dr. Johnnye Sima and Dr. Nelda Marseille, plan for bronchoscopy for samples provided third sputum is negative for AFB. 2. Sinus tachycardia, mild: Clinically euthyroid and euvolemic. TSH mildly low at 0.325. Free T3 mildly below normal. Free T4 normal. Likely sick euthyroid. Follow TFTs in 4-6 weeks.  DVT prophylaxis: SCDs Code Status: Full Family Communication: Discussed in detail with patient's spouse at  bedside. Disposition: DC home when medically improved. Not medically ready for discharge.   Consultants:  Infectious disease  Pulmonology.  Procedures:  None  Antimicrobials:  IV ceftriaxone and azithromycin    Subjective: Had mild transient left upper chest pain. Cough with intermittent white sputum. No hemoptysis.  ROS: As above  Objective:  Vitals:   10/13/17 1528 10/13/17 2026 10/14/17 0530 10/14/17 1521  BP: 115/67 127/74 117/72 127/84  Pulse: 87 91 70 91  Resp: _0 Temp: 98.1 F (36.7 C) 98.2 F (36.8 C) 98.1 F (36.7 C) 98.6 F (37 C)  TempSrc: Oral Oral Oral Oral  SpO2: 100% 100% 100% 100%  Weight:      Height:        Examination:  General exam: Pleasant middle-aged male, moderately built and nourished, sitting up comfortably in bed. Has not appeared in any distress since admission. Lymphatics: No generalized lymphadenopathy. Respiratory system: Few crackles in left upper lung fields anteriorly. Rest of lung fields clear to auscultation. No increased work of breathing. Stable without change. Cardiovascular system: S1 & S2 heard, RRR. No JVD, murmurs, rubs, gallops or clicks. No pedal edema. Stable without change. Gastrointestinal system: Abdomen is nondistended, soft and nontender. No organomegaly or masses felt. Normal bowel sounds heard. Central nervous system: Alert and oriented. No focal neurological deficits. Extremities: Symmetric 5 x 5 power. Skin: No rashes, lesions or ulcers Psychiatry: Judgement and insight appear normal. Mood & affect appropriate.     Data Reviewed: I have personally reviewed following labs and imaging studies  CBC: Recent Labs  Lab 10/09/17 1333  WBC 6.6  HGB 12.8*  HCT 37.3*  MCV  86.3  PLT 242   Basic Metabolic Panel: Recent Labs  Lab 10/09/17 1333  NA 136  K 3.9  CL 99*  CO2 27  GLUCOSE 104*  BUN 10  CREATININE 0.88  CALCIUM 9.2     Recent Results (from the past 240 hour(s))  Blood culture  (routine x 2)     Status: None   Collection Time: 10/09/17  8:15 PM  Result Value Ref Range Status   Specimen Description BLOOD LEFT ANTECUBITAL  Final   Special Requests   Final    BOTTLES DRAWN AEROBIC AND ANAEROBIC Blood Culture adequate volume   Culture NO GROWTH 5 DAYS  Final   Report Status 10/14/2017 FINAL  Final  Blood culture (routine x 2)     Status: None   Collection Time: 10/09/17  8:47 PM  Result Value Ref Range Status   Specimen Description BLOOD LEFT ARM  Final   Special Requests   Final    BLOOD AEROBIC BOTTLE Blood Culture results may not be optimal due to an inadequate volume of blood received in culture bottles   Culture NO GROWTH 5 DAYS  Final   Report Status 10/14/2017 FINAL  Final  Acid Fast Smear (AFB)     Status: None   Collection Time: 10/11/17  5:00 AM  Result Value Ref Range Status   AFB Specimen Processing Concentration  Final   Acid Fast Smear Negative  Final    Comment: (NOTE) Performed At: [facility] [place], [place] [id number] Rush Farmer MD [id number]    Source (AFB) SPUTUM  Final  Acid Fast Smear (AFB)     Status: None   Collection Time: 10/11/17 12:27 PM  Result Value Ref Range Status   AFB Specimen Processing Concentration  Final   Acid Fast Smear Negative  Final    Comment: (NOTE) Performed At: [facility] [address] [place], [place] [id number] Rush Farmer MD [id number]    Source (AFB) SPUTUM  Final  Culture, sputum-assessment     Status: None   Collection Time: 10/12/17 12:27 PM  Result Value Ref Range Status   Specimen Description EXPECTORATED SPUTUM  Final   Special Requests NONE  Final   Sputum evaluation THIS SPECIMEN IS ACCEPTABLE FOR SPUTUM CULTURE  Final   Report Status 10/12/2017 FINAL  Final  Culture, respiratory (NON-Expectorated)     Status: None (Preliminary result)   Collection Time: 10/12/17 12:27 PM  Result Value Ref Range Status   Specimen Description EXPECTORATED  SPUTUM  Final   Special Requests NONE Reflexed from [id number]  Final   Gram Stain   Final    RARE SQUAMOUS EPITHELIAL CELLS PRESENT MODERATE WBC PRESENT, PREDOMINANTLY PMN ABUNDANT GRAM POSITIVE COCCI IN CHAINS    Culture CULTURE REINCUBATED FOR BETTER GROWTH  Final   Report Status PENDING  Incomplete         Radiology Studies: No results found.      Scheduled Meds:  Continuous Infusions: . cefTRIAXone (ROCEPHIN)  IV Stopped (10/13/17 2115)     LOS: 5 days     Vernell Leep, MD, FACP, [facility]. [facility] Pager [phone]  If 7PM-7AM, please contact night-coverage [email or web address] Password [email or web address] 10/14/2017, 4:12 PM

## 2017-10-15 ENCOUNTER — Encounter (HOSPITAL_COMMUNITY): Payer: Self-pay | Admitting: Infectious Disease

## 2017-10-15 ENCOUNTER — Inpatient Hospital Stay (HOSPITAL_COMMUNITY): Payer: 59

## 2017-10-15 ENCOUNTER — Encounter (HOSPITAL_COMMUNITY)
Admission: EM | Disposition: A | Discharge status: Home / Self Care | Payer: Self-pay | Source: Home / Self Care | Attending: Internal Medicine

## 2017-10-15 DIAGNOSIS — A150 Tuberculosis of lung: Secondary | ICD-10-CM

## 2017-10-15 DIAGNOSIS — R042 Hemoptysis: Secondary | ICD-10-CM

## 2017-10-15 HISTORY — PX: VIDEO BRONCHOSCOPY: SHX5072

## 2017-10-15 LAB — HIV ANTIBODY (ROUTINE TESTING W REFLEX): HIV Screen 4th Generation wRfx: NONREACTIVE

## 2017-10-15 LAB — CULTURE, RESPIRATORY: CULTURE: NORMAL

## 2017-10-15 SURGERY — VIDEO BRONCHOSCOPY WITHOUT FLUORO
Anesthesia: Moderate Sedation | Laterality: Bilateral

## 2017-10-15 MED ORDER — MIDAZOLAM HCL 10 MG/2ML IJ SOLN
INTRAMUSCULAR | Status: DC | PRN
  Administered 2017-10-15 (×2): 1 mg via INTRAVENOUS

## 2017-10-15 MED ORDER — LIDOCAINE HCL 2 % EX GEL
CUTANEOUS | Status: DC | PRN
  Administered 2017-10-15: 1

## 2017-10-15 MED ORDER — SODIUM CHLORIDE 0.9 % IV SOLN
Freq: Once | INTRAVENOUS | Status: AC
  Administered 2017-10-15: 13:00:00 via INTRAVENOUS

## 2017-10-15 MED ORDER — FENTANYL CITRATE (PF) 100 MCG/2ML IJ SOLN
INTRAMUSCULAR | Status: AC
  Filled 2017-10-15: qty 4

## 2017-10-15 MED ORDER — MIDAZOLAM HCL 5 MG/ML IJ SOLN
INTRAMUSCULAR | Status: AC
  Filled 2017-10-15: qty 2

## 2017-10-15 MED ORDER — VITAMIN B-6 100 MG PO TABS
50.0000 mg | ORAL_TABLET | Freq: Every day | ORAL | Status: DC
  Administered 2017-10-15 – 2017-10-16 (×2): 50 mg via ORAL
  Filled 2017-10-15 (×2): qty 1

## 2017-10-15 MED ORDER — PHENYLEPHRINE HCL 0.25 % NA SOLN
NASAL | Status: DC | PRN
  Administered 2017-10-15: 2 via NASAL

## 2017-10-15 MED ORDER — PYRAZINAMIDE 500 MG PO TABS
1500.0000 mg | ORAL_TABLET | Freq: Every day | ORAL | Status: DC
  Administered 2017-10-15 – 2017-10-16 (×2): 1500 mg via ORAL
  Filled 2017-10-15 (×2): qty 3

## 2017-10-15 MED ORDER — ISONIAZID 300 MG PO TABS
300.0000 mg | ORAL_TABLET | Freq: Every day | ORAL | Status: DC
  Administered 2017-10-15 – 2017-10-16 (×2): 300 mg via ORAL
  Filled 2017-10-15 (×2): qty 1

## 2017-10-15 MED ORDER — LIDOCAINE HCL (PF) 1 % IJ SOLN
INTRAMUSCULAR | Status: DC | PRN
  Administered 2017-10-15: 6 mL

## 2017-10-15 MED ORDER — ETHAMBUTOL HCL 400 MG PO TABS
1200.0000 mg | ORAL_TABLET | Freq: Every day | ORAL | Status: DC
  Administered 2017-10-15 – 2017-10-16 (×2): 1200 mg via ORAL
  Filled 2017-10-15 (×2): qty 3

## 2017-10-15 MED ORDER — RIFAMPIN 300 MG PO CAPS
600.0000 mg | ORAL_CAPSULE | Freq: Every day | ORAL | Status: DC
  Administered 2017-10-15 – 2017-10-16 (×2): 600 mg via ORAL
  Filled 2017-10-15 (×2): qty 2

## 2017-10-15 MED ORDER — FENTANYL CITRATE (PF) 100 MCG/2ML IJ SOLN
INTRAMUSCULAR | Status: DC | PRN
  Administered 2017-10-15 (×2): 50 ug via INTRAVENOUS

## 2017-10-15 NOTE — Progress Notes (Signed)
Video bronchoscopy performed.  Intervention bronchial washing.  No complications noted. 

## 2017-10-15 NOTE — Procedures (Signed)
Bronchoscopy Procedure Note Jason Clark [id number] 10-31-1955  Procedure: Bronchoscopy Indications: Diagnostic evaluation of the airways and Obtain specimens for culture and/or other diagnostic studies  Procedure Details Consent: Risks of procedure as well as the alternatives and risks of each were explained to the (patient/caregiver).  Consent for procedure obtained. Time Out: Verified patient identification, verified procedure, site/side was marked, verified correct patient position, special equipment/implants available, medications/allergies/relevent history reviewed, required imaging and test results available.  Performed  In preparation for procedure, patient was given 100% FiO2 and bronchoscope lubricated. Sedation: Benzodiazepines and Fentanyl  Airway entered and the following bronchi were examined: RUL, RML, RLL, LUL, LLL and Bronchi.   Diffusely inflamed airway with petechial lesions consistent with chronic bronchitis. No source of bleeding note BAL from LUL to lab Bronchoscope removed.    Evaluation Hemodynamic Status: BP stable throughout; O2 sats: stable throughout Patient's Current Condition: stable Specimens:  Sent purulent fluid Complications: No apparent complications Patient did tolerate procedure well.   Jason Clark 10/15/2017

## 2017-10-15 NOTE — Progress Notes (Signed)
Patient being transported to respiratory for procedure with surgical mask in place due to being on airborne precautions.  IV intact and saline locked.  Consent has been signed and patient voided prior to transport.  Patient has been NPO since midnight.  Will await patient return.

## 2017-10-15 NOTE — Consult Note (Signed)
Name: Jason Clark MRN: [id number] DOB: 07/29/1956    ADMISSION DShona SimpsonE:  10/09/2017 CONSULTATION DATE: 12/17  REFERRING MD :  Waymon AmatoHongalgi   CHIEF COMPLAINT:  Cavitary PNA   BRIEF PATIENT DESCRIPTION:  61 year old male originally from [place]. Admitted 12/12 w/ hemoptysis and LUL cavitary PNA. Initial AFBs negative. PCCM asked to see by ID for possible FOB.    SIGNIFICANT EVENTS    STUDIES:  CT chest 12/17: Dense airspace consolidation of the anterior left upper lobe with small areas of internal cavitation. Findings are consistent with acute pneumonia. Associated numerous high density calcified lymph nodes throughout the mediastinum and in both hilar regions. These nodes are suggestive of either prior granulomatous infection or other treated disease. Active tuberculosis should be excluded based on the presence of these high density lymph nodes and the pneumonic consolidation present.   HISTORY OF PRESENT ILLNESS:   A 61 year old patient without any significant past medical history. Originally from [place] in early 2000s.  Presented to the emergency room on 12/12 with 3-week history of cough, and hemoptysis over 4 days.  CT chest was obtained demonstrating left upper lobe infiltrate with cavitation.  He was placed on respiratory isolation, AFB's were collected and he was placed on empiric antibiotics for community-acquired pneumonia.  He has been seen in consultation by infectious disease. Diagnostic data to date: 2 of 3 AFBs have resulted as negative, sputum culture demonstrating abundant gram-positive cocci in chains, still in preliminary evaluation, urine strep negative, rapid HIV nonreactive.  He is felt to be high risk for TB and if he is to date have been negative, because of this infectious disease has asked for pulmonary to see an effort to obtain bronchial alveolar lavage.  SUBJECTIVE:  Feels better, minimal hemoptysis  VITAL SIGNS: Temp:  [97.7 F (36.5 C)-98.6  F (37 C)] 97.7 F (36.5 C) (12/18 0552) Pulse Rate:  [91-94] 94 (12/18 0552) Resp:  [16] 16 (12/18 0552) BP: (110-127)/(78-84) 110/78 (12/18 0552) SpO2:  [100 %] 100 % (12/18 0552)  PHYSICAL EXAMINATION: General: Well appearing, NAD Neuro: Alert and oriented, moving all ext to commands HEENT: [place]/AT, PERRL, EOM-I and MMM Cardiovascular: RRR, Nl S1/S2, -M/R/G. Lungs: CTA bilaterally Abdomen: Soft, NT, ND and +BS Musculoskeletal: Equal strength, bulk Skin: Warm and dry  Recent Labs  Lab 10/09/17 1333  NA 136  K 3.9  CL 99*  CO2 27  BUN 10  CREATININE 0.88  GLUCOSE 104*   Recent Labs  Lab 10/09/17 1333  HGB 12.8*  HCT 37.3*  WBC 6.6  PLT 206   I reviewed chest CT myself, cavitation at the LUL noted  ASSESSMENT / PLAN:  61 year old male recent immigrant from [place] who present with fever, cough and cavitary pneumonia of the LUL on CT that I reviewed myself.  On exam, lungs are clear with no wheezing.  Discussed with PCCM-NP.  CAP:             - Continue rocephin             - Appreciate input from ID             - F/u on cultures of the sputum sent to lab  TB:             - Bronch today               - AFB and cultures + cytology sent  Hemoptysis:             -  No evidence of bleeding noted but very inflamed airway likely the cause, monitor closely.  Hypoxemia:             - Titrate O2 for sat of 92-95%, anticipate to be off prior to discharge.  Calcified lymph notes: highly suspicious for TB             - F/U imaging as needed.  PCCM will f/u.  Alyson ReedyWesam G. Yacoub, M.D. [facility]. Pager: [phone]. After hours pager: [phone].

## 2017-10-15 NOTE — Progress Notes (Signed)
Tech asked the patient if he would like to wash up. Pt said no not at all today.

## 2017-10-15 NOTE — Progress Notes (Signed)
PROGRESS NOTE   Jason Clark  PIR:[id number]    DOB: 09/14/1956    DOA: 10/09/2017  PCP: Elwyn Reach, MD   I have briefly reviewed patients previous medical records in [facility].  Brief Narrative:  61 year old male, originally from [place], speaks [place] and limited [place], with no known significant PMH presented to ED with one-week history of left sided chest pain, worse with inspiration, cough with intermittent mild blood and sputum, no dyspnea or fever or chills. Reports normal appetite and no weight loss. CT chest showed LUL dense airspace consolidation with internal cavitation and hilar lymph node. Admitted for cavitating pneumonia, rule out TB. ID consulted. Status post bronchoscopy and BAL for samples by Pulmonology on 12/18, follow results.   Assessment & Plan:   Principal Problem:   Pulmonary cavitary lesion Active Problems:   Community acquired pneumonia of left upper lobe of lung ([place])   Hypoxemia   Mediastinal lymphadenopathy   1. Left upper lobe cavitary pneumonia: R/O pulmonary TB versus other etiologies. Patient reports that he's been in this country since 2002 and has not gone back to [place]. Denies personal or family history of TB. Nonsmoker. Check sputum AFB (2/3 AFB smear negative). HIV screen negative. Blood cultures 2: Negative to date. Empirically treating for community-acquired pneumonia with ceftriaxone and azithromycin. ID input appreciated, high risk for TB. Status post bronchoscopy and BAL for samples by Pulmonology on 12/18, follow results. 2. Sinus tachycardia, mild: Clinically euthyroid and euvolemic. TSH mildly low at 0.325. Free T3 mildly below normal. Free T4 normal. Likely sick euthyroid. Follow TFTs in 4-6 weeks.  DVT prophylaxis: SCDs Code Status: Full Family Communication: Discussed in detail with patient's spouse at bedside. Disposition: DC home when medically improved. Not medically ready for discharge.   Consultants:  Infectious disease    Pulmonology.  Procedures:  None  Antimicrobials:  IV ceftriaxone and azithromycin    Subjective: Seen this morning prior to procedure. Cough with white sputum. No hemoptysis. No chest pain reported.  ROS: As above  Objective:  Vitals:   10/15/17 1325 10/15/17 1340 10/15/17 1355 10/15/17 1436  BP: (!) 217/105  (!) 147/63 114/79  Pulse: (!) 110 88 82 81  Resp: (!) _0 Temp:    97.9 F (36.6 C)  TempSrc:    Oral  SpO2: 100% 98% 96% 100%  Weight:      Height:        Examination:  General exam: Pleasant middle-aged male, moderately built and nourished, lying comfortably in bed. Has not appeared in any distress since admission. Lymphatics: No generalized lymphadenopathy. Respiratory system: Few crackles in left upper lung fields anteriorly. Rest of lung fields clear to auscultation. No increased work of breathing. Stable without change. Cardiovascular system: S1 & S2 heard, RRR. No JVD, murmurs, rubs, gallops or clicks. No pedal edema. Stable without change. Gastrointestinal system: Abdomen is nondistended, soft and nontender. No organomegaly or masses felt. Normal bowel sounds heard. Central nervous system: Alert and oriented. No focal neurological deficits. Extremities: Symmetric 5 x 5 power. Skin: No rashes, lesions or ulcers Psychiatry: Judgement and insight appear normal. Mood & affect appropriate.     Data Reviewed: I have personally reviewed following labs and imaging studies  CBC: Recent Labs  Lab 10/09/17 1333  WBC 6.6  HGB 12.8*  HCT 37.3*  MCV 86.3  PLT 630   Basic Metabolic Panel: Recent Labs  Lab 10/09/17 1333  NA 136  K 3.9  CL 99*  CO2 27  GLUCOSE 104*  BUN 10  CREATININE 0.88  CALCIUM 9.2     Recent Results (from the past 240 hour(s))  Blood culture (routine x 2)     Status: None   Collection Time: 10/09/17  8:15 PM  Result Value Ref Range Status   Specimen Description BLOOD LEFT ANTECUBITAL  Final   Special Requests    Final    BOTTLES DRAWN AEROBIC AND ANAEROBIC Blood Culture adequate volume   Culture NO GROWTH 5 DAYS  Final   Report Status 10/14/2017 FINAL  Final  Blood culture (routine x 2)     Status: None   Collection Time: 10/09/17  8:47 PM  Result Value Ref Range Status   Specimen Description BLOOD LEFT ARM  Final   Special Requests   Final    BLOOD AEROBIC BOTTLE Blood Culture results may not be optimal due to an inadequate volume of blood received in culture bottles   Culture NO GROWTH 5 DAYS  Final   Report Status 10/14/2017 FINAL  Final  Acid Fast Smear (AFB)     Status: None   Collection Time: 10/11/17  5:00 AM  Result Value Ref Range Status   AFB Specimen Processing Concentration  Final   Acid Fast Smear Negative  Final    Comment: (NOTE) Performed At: [facility] [place], [place] [id number] Rush Farmer MD [id number]    Source (AFB) SPUTUM  Final  Acid Fast Smear (AFB)     Status: None   Collection Time: 10/11/17 12:27 PM  Result Value Ref Range Status   AFB Specimen Processing Concentration  Final   Acid Fast Smear Negative  Final    Comment: (NOTE) Performed At: [facility] [address] [place], [place] [id number] Rush Farmer MD [id number]    Source (AFB) SPUTUM  Final  Culture, sputum-assessment     Status: None   Collection Time: 10/12/17 12:27 PM  Result Value Ref Range Status   Specimen Description EXPECTORATED SPUTUM  Final   Special Requests NONE  Final   Sputum evaluation THIS SPECIMEN IS ACCEPTABLE FOR SPUTUM CULTURE  Final   Report Status 10/12/2017 FINAL  Final  Culture, respiratory (NON-Expectorated)     Status: None   Collection Time: 10/12/17 12:27 PM  Result Value Ref Range Status   Specimen Description EXPECTORATED SPUTUM  Final   Special Requests NONE Reflexed from [id number]  Final   Gram Stain   Final    RARE SQUAMOUS EPITHELIAL CELLS PRESENT MODERATE WBC PRESENT, PREDOMINANTLY PMN ABUNDANT GRAM POSITIVE  COCCI IN CHAINS    Culture FEW Consistent with normal respiratory flora.  Final   Report Status 10/15/2017 FINAL  Final         Radiology Studies: No results found.      Scheduled Meds:  Continuous Infusions: . cefTRIAXone (ROCEPHIN)  IV Stopped (10/14/17 2035)     LOS: 6 days     Vernell Leep, MD, FACP, [facility]. [facility] Pager [phone]  If 7PM-7AM, please contact night-coverage [email or web address] Password [email or web address] 10/15/2017, 7:25 PM

## 2017-10-15 NOTE — Progress Notes (Signed)
Subjective: No new complaints   Antibiotics:  Anti-infectives (From admission, onward)   Start     Dose/Rate Route Frequency Ordered Stop   10/15/17 2130  ethambutol (MYAMBUTOL) tablet 1,200 mg     1,200 mg Oral Daily 10/15/17 2116     10/15/17 2130  isoniazid (NYDRAZID) tablet 300 mg     300 mg Oral Daily 10/15/17 2116     10/15/17 2130  rifampin (RIFADIN) capsule 600 mg     600 mg Oral Daily 10/15/17 2116     10/15/17 2130  pyrazinamide tablet 1,500 mg     1,500 mg Oral Daily 10/15/17 2116     10/10/17 2000  azithromycin (ZITHROMAX) tablet 500 mg  Status:  Discontinued     500 mg Oral Every 24 hours 10/09/17 2023 10/14/17 1050   10/09/17 2100  cefTRIAXone (ROCEPHIN) 1 g in dextrose 5 % 50 mL IVPB     1 g 100 mL/hr over 30 Minutes Intravenous Every 24 hours 10/09/17 2023 10/16/17 2059   10/09/17 1945  cefTRIAXone (ROCEPHIN) 1 g in dextrose 5 % 50 mL IVPB  Status:  Discontinued     1 g 100 mL/hr over 30 Minutes Intravenous  Once 10/09/17 1939 10/09/17 2026   10/09/17 1945  azithromycin (ZITHROMAX) tablet 500 mg     500 mg Oral  Once 10/09/17 1939 10/09/17 2016      Medications: Scheduled Meds: . ethambutol  1,200 mg Oral Daily  . isoniazid  300 mg Oral Daily  . pyrazinamide  1,500 mg Oral Daily  . vitamin B-6  50 mg Oral Daily  . rifampin  600 mg Oral Daily   Continuous Infusions: . cefTRIAXone (ROCEPHIN)  IV Stopped (10/14/17 2035)   PRN Meds:.acetaminophen, guaiFENesin-dextromethorphan    Objective: Weight change:   Intake/Output Summary (Last 24 hours) at 10/15/2017 2117 Last data filed at 10/15/2017 1630 Gross per 24 hour  Intake 340 ml  Output -  Net 340 ml   Blood pressure 114/79, pulse 81, temperature 97.9 F (36.6 C), temperature source Oral, resp. rate 14, height 5\' 8"  (1.727 m), weight 165 lb (74.8 kg), SpO2 100 %. Temp:  [97.7 F (36.5 C)-97.9 F (36.6 C)] 97.9 F (36.6 C) (12/18 1436) Pulse Rate:  [81-129] 81 (12/18 1436) Resp:   [11-27] 14 (12/18 1436) BP: (110-217)/(63-105) 114/79 (12/18 1436) SpO2:  [96 %-100 %] 100 % (12/18 1436) Weight:  [165 lb (74.8 kg)] 165 lb (74.8 kg) (12/18 1249)  Physical Exam: General: Alert and awake, oriented x3, not in any acute distress. HEENT: anicteric sclera,EOMI CVS regular rate, normal r,  no murmur rubs or gallops Chest:  no wheezing, no resp distress Abdomen: soft nontender, nondistended, normal bowel sounds, Extremities: no  clubbing or edema noted bilaterally Skin: no rashes Neuro: nonfocal  CBC: CBC Latest Ref Rng & Units 10/09/2017 08/31/2012 06/04/2010  WBC 4.0 - 10.5 K/uL 6.6 8.2 -  Hemoglobin 13.0 - 17.0 g/dL 12.8(L) 13.8 15.6  Hematocrit 39.0 - 52.0 % 37.3(L) 38.5(L) 46.0  Platelets 150 - 400 K/uL 206 127(L) -      BMET No results for input(s): NA, K, CL, CO2, GLUCOSE, BUN, CREATININE, CALCIUM in the last 72 hours.   Liver Panel  No results for input(s): PROT, ALBUMIN, AST, ALT, ALKPHOS, BILITOT, BILIDIR, IBILI in the last 72 hours.     Sedimentation Rate No results for input(s): ESRSEDRATE in the last 72 hours. C-Reactive Protein No results for input(s): CRP in  the last 72 hours.  Micro Results: Recent Results (from the past 720 hour(s))  Blood culture (routine x 2)     Status: None   Collection Time: 10/09/17  8:15 PM  Result Value Ref Range Status   Specimen Description BLOOD LEFT ANTECUBITAL  Final   Special Requests   Final    BOTTLES DRAWN AEROBIC AND ANAEROBIC Blood Culture adequate volume   Culture NO GROWTH 5 DAYS  Final   Report Status 10/14/2017 FINAL  Final  Blood culture (routine x 2)     Status: None   Collection Time: 10/09/17  8:47 PM  Result Value Ref Range Status   Specimen Description BLOOD LEFT ARM  Final   Special Requests   Final    BLOOD AEROBIC BOTTLE Blood Culture results may not be optimal due to an inadequate volume of blood received in culture bottles   Culture NO GROWTH 5 DAYS  Final   Report Status 10/14/2017  FINAL  Final  Acid Fast Smear (AFB)     Status: None   Collection Time: 10/11/17  5:00 AM  Result Value Ref Range Status   AFB Specimen Processing Concentration  Final   Acid Fast Smear Negative  Final    Comment: (NOTE) Performed At: [facility] [address] [place], [place] [id number] Jolene SchimkeNagendra Sanjai MD DD:[id number]    Source (AFB) SPUTUM  Final  Acid Fast Smear (AFB)     Status: None   Collection Time: 10/11/17 12:27 PM  Result Value Ref Range Status   AFB Specimen Processing Concentration  Final   Acid Fast Smear Negative  Final    Comment: (NOTE) Performed At: [facility] [address] [place], [place] [id number] Jolene SchimkeNagendra Sanjai MD VV:[id number]    Source (AFB) SPUTUM  Final  Culture, sputum-assessment     Status: None   Collection Time: 10/12/17 12:27 PM  Result Value Ref Range Status   Specimen Description EXPECTORATED SPUTUM  Final   Special Requests NONE  Final   Sputum evaluation THIS SPECIMEN IS ACCEPTABLE FOR SPUTUM CULTURE  Final   Report Status 10/12/2017 FINAL  Final  Culture, respiratory (NON-Expectorated)     Status: None   Collection Time: 10/12/17 12:27 PM  Result Value Ref Range Status   Specimen Description EXPECTORATED SPUTUM  Final   Special Requests NONE Reflexed from [id number]  Final   Gram Stain   Final    RARE SQUAMOUS EPITHELIAL CELLS PRESENT MODERATE WBC PRESENT, PREDOMINANTLY PMN ABUNDANT GRAM POSITIVE COCCI IN CHAINS    Culture FEW Consistent with normal respiratory flora.  Final   Report Status 10/15/2017 FINAL  Final    Studies/Results: No results found.    Assessment/Plan:  INTERVAL HISTORY: pt sp bronchoscopy   Principal Problem:   Pulmonary cavitary lesion Active Problems:   Community acquired pneumonia of left upper lobe of lung ([facility])   Hypoxemia   Mediastinal lymphadenopathy    Jason Clark is a 61 y.o. [place] from [place] with hemoptysis and found to have LUL dense consolidation with cavitation  highly concerning for MTB. Sputa are AFB negative x 2. He is now sp bronchoscopy  #1 Possible TB:  I am starting him on 4 drug therapy empirically for TB  I have contacted the [facility] and told them I expect he may be DC tomorrow  Please make sure Case Management and primary team are communicating with [facility] re pts DC and inititiation of treatment empirically for TB  I will sign off for now  Please call with further questions.    LOS: 6 days   Acey LavCornelius [place] 10/15/2017, 9:17 PM

## 2017-10-15 NOTE — Progress Notes (Signed)
Patient arrived back to 6N32 A&Ox4, VSS, IV intact.  Per respiratory tested presence of gag reflex and swallowing ability by providing a few ice chips and then introducing water to sip.  No coughing.  Upgraded diet to regular.  Will continue to monitor.

## 2017-10-16 DIAGNOSIS — J181 Lobar pneumonia, unspecified organism: Secondary | ICD-10-CM

## 2017-10-16 DIAGNOSIS — J984 Other disorders of lung: Secondary | ICD-10-CM

## 2017-10-16 LAB — HCV COMMENT:

## 2017-10-16 LAB — HEPATITIS B SURFACE ANTIGEN: Hepatitis B Surface Ag: NEGATIVE

## 2017-10-16 LAB — HEPATITIS C ANTIBODY (REFLEX): HCV Ab: <0.1 s/co ratio (ref 0.0–0.9)

## 2017-10-16 MED ORDER — PYRAZINAMIDE 500 MG PO TABS: 1500.0000 mg | ORAL_TABLET | Freq: Every day | ORAL | Status: AC

## 2017-10-16 MED ORDER — ETHAMBUTOL HCL 400 MG PO TABS: 1200.0000 mg | ORAL_TABLET | Freq: Every day | ORAL | Status: AC

## 2017-10-16 MED ORDER — RIFAMPIN 300 MG PO CAPS: 600.0000 mg | ORAL_CAPSULE | Freq: Every day | ORAL | Status: AC

## 2017-10-16 MED ORDER — PYRIDOXINE HCL 50 MG PO TABS: 50.0000 mg | ORAL_TABLET | Freq: Every day | ORAL | 0 refills | Status: AC

## 2017-10-16 MED ORDER — ISONIAZID 300 MG PO TABS: 300.0000 mg | ORAL_TABLET | Freq: Every day | ORAL | Status: AC

## 2017-10-16 NOTE — Progress Notes (Signed)
   Name: Jason Clark MRN: [id number] DOB: 06/09/1956    ADMISSION DATE:  10/09/2017 CONSULTATION DATE: 12/17  REFERRING MD :  Algis Liming   CHIEF COMPLAINT:  Cavitary PNA   BRIEF PATIENT DESCRIPTION:  61 year old male originally from [place]. Admitted 12/12 w/ hemoptysis and LUL cavitary PNA. Initial AFBs negative. PCCM asked to see by ID for possible FOB.    SIGNIFICANT EVENTS  FOB done 12/18  STUDIES:  CT chest 12/17: Dense airspace consolidation of the anterior left upper lobe with small areas of internal cavitation. Findings are consistent with acute pneumonia. Associated numerous high density calcified lymph nodes throughout the mediastinum and in both hilar regions. These nodes are suggestive of either prior granulomatous infection or other treated disease. Active tuberculosis should be excluded based on the presence of these high density lymph nodes and the pneumonic consolidation present.   HISTORY OF PRESENT ILLNESS:   A 61 year old patient without any significant past medical history. Originally from [place] relocated to [place] in early 2000s.  Presented to the emergency room on 12/12 with 3-week history of cough, and hemoptysis over 4 days.  CT chest was obtained demonstrating left upper lobe infiltrate with cavitation.  He was placed on respiratory isolation, AFB's were collected and he was placed on empiric antibiotics for community-acquired pneumonia.  He has been seen in consultation by infectious disease. Diagnostic data to date: 2 of 3 AFBs have resulted as negative, sputum culture demonstrating abundant gram-positive cocci in chains, still in preliminary evaluation, urine strep negative, rapid HIV nonreactive.  He is felt to be high risk for TB and if he is to date have been negative, because of this infectious disease has asked for pulmonary to see an effort to obtain bronchial alveolar lavage.  SUBJECTIVE:  Tolerated FOB Has been started empirically on 4 drug therapy for  suspected TB  VITAL SIGNS: Temp:  [97.5 F (36.4 C)-97.9 F (36.6 C)] 97.5 F (36.4 C) (12/19 0505) Pulse Rate:  [81-129] 97 (12/19 0505) Resp:  [11-27] 16 (12/19 0505) BP: (111-217)/(63-105) 129/83 (12/19 0505) SpO2:  [96 %-100 %] 100 % (12/19 0505) Weight:  [74.8 kg (165 lb)] 74.8 kg (165 lb) (12/18 1249)  PHYSICAL EXAMINATION: General: Well appearing, NAD Neuro: Alert and oriented, moving all ext to commands HEENT: [place]/AT, PERRL, EOM-I and MMM Cardiovascular: RRR, Nl S1/S2, -M/R/G. Lungs: CTA bilaterally Abdomen: Soft, NT, ND and +BS Musculoskeletal: Equal strength, bulk Skin: Warm and dry  Recent Labs  Lab 10/09/17 1333  NA 136  K 3.9  CL 99*  CO2 27  BUN 10  CREATININE 0.88  GLUCOSE 104*   Recent Labs  Lab 10/09/17 1333  HGB 12.8*  HCT 37.3*  WBC 6.6  PLT 206     ASSESSMENT / PLAN:  S/p FOB for presumed TB (vs CAP)  - I sent the BAL fluid from 12/18 for AFB - agree with empiric TB therapy, to be maintained by [facility]  Please call if we can help further.    Baltazar Apo, MD, PhD 10/16/2017, 10:10 AM [facility] [phone] or if no answer [phone]

## 2017-10-16 NOTE — Progress Notes (Signed)
Gevena CottonIdi Markos discharger per MD order. Discussed with the patient verbalizes understanding.    VSS, Skin clean, dry and intact without evidence of skin break down, no evidence of skin tears noted.  IV catheter discontinued intact. Site without signs and symptoms of complications. Dressing and pressure applied.  An After Visit Summary was printed and given to the patient.   Eber JonesCarolyn from the [facility] will follow up with patient to administer medications.    Patient informed he must go straight home and stay there.  Informed it is very important he remain in his own home.  Notified Carolyn from HD at time of discharge so she can contact him and further stress the importance of home isolation.  Placed mask on the patient and informed he must wear this mask all the way home.    Discharge education completed with patient including follow up instructions, medication list, d/c activities limitations if indicated, with other d/c instructions as indicated by MD - patient able to verbalize understanding.  Patient instructed to return to ED, call 911, or call MD for any changes in condition.   Patient discharged home via private auto.

## 2017-10-16 NOTE — Progress Notes (Signed)
Patient c/o of upset stomach with new medicine regimin.  Nurse explains that the meds need to be taken with food.  Patient refuses to eat.  Nurse provided milk to take with medicine to help with stomach discomfort.

## 2017-10-16 NOTE — Care Management Note (Addendum)
Case Management Note  Patient Details  Name: Jason Clark MRN: [id number] Date of Birth: 01/31/1956  Subjective/Objective:                    Action/Plan: Plan patient will discharge today . [facility]. Jason JonesCarolyn wanted NCM to tell patient he is on Home Isolation, he needs to go straight home and stay there . Jason JonesCarolyn will call him and arrange a time to meet him at his home tomorrow to provide all his TB medications and assume care.Patient has had all his TB medications for today.   The above was explained to patient and family at bedside. Both voiced understanding. Bedside nurse Jason Clark aware also.      [facility] [phone] . Jason JonesCarolyn needs to know discharge date, ID note from yesterday saids possible today . Health Department will supply TB meds and needs to complete paperwork with patient . Health Department will be closed Saturday 21, 2018 through Thursday Thursday October 24, 2017. Paged Dr Melynda RippleHobbs, awaiting call back. Expected Discharge Date:                  Expected Discharge Plan:  Home/Self Care  In-House Referral:     Discharge planning Services  CM Consult  Post Acute Care Choice:    Choice offered to:     DME Arranged:    DME Agency:     HH Arranged:    HH Agency:     Status of Service:  In process, will continue to follow  If discussed at Long Length of Stay Meetings, dates discussed:    Additional Comments:  Jason Clark, Jason Rao Marie, RN 10/16/2017, 11:15 AM

## 2017-10-16 NOTE — Discharge Summary (Signed)
Physician Discharge Summary  Jason Clark UYQ:[id number] DOB: 11/19/1955 DOA: 10/09/2017  PCP: Elwyn Reach, MD  Admit date: 10/09/2017 Discharge date: 10/16/2017  Time spent: 45 minutes  Recommendations for Outpatient Follow-up:  1. F/U with PCP   Discharge Diagnoses:  Principal Problem:   Pulmonary cavitary lesion Active Problems:   Community acquired pneumonia of left upper lobe of lung ([place])   Hypoxemia   Mediastinal lymphadenopathy   Pulmonary TB   Discharge Condition: stable  Diet recommendation: Regular  Filed Weights   10/09/17 1331 10/15/17 1249  Weight: 74.8 kg (165 lb) 74.8 kg (165 lb)    History of present illness/Hospital Course:   61 year old male, originally from [place], speaks [place] and limited [place], with no known significant PMH presented to ED with one-week history of left sided chest pain, worse with inspiration, cough with intermittent mild blood and sputum, no dyspnea or fever or chills. Reports normal appetite and no weight loss. CT chest showed LUL dense airspace consolidation with internal cavitation and hilar lymph node. Admitted for cavitating pneumonia, rule out TB. ID consulted. Status post bronchoscopy and BAL for samples by Pulmonology on 12/18, follow results. F/u organized with health department. Pt d/c on meds & advised he is on home quarantine.  Consultations:  Pulmonology  Discharge Exam: Vitals:   10/15/17 2335 10/16/17 0505  BP: 111/69 129/83  Pulse: 88 97  Resp: 15 16  Temp: 97.9 F (36.6 C) (!) 97.5 F (36.4 C)  SpO2: 99% 100%    General: NCAT, NAD Cardiovascular: RRR, no MRG Respiratory: CTAB, nl wob  Discharge Instructions    Allergies as of 10/16/2017   No Known Allergies     Medication List    STOP taking these medications   ADVIL 200 MG tablet Generic drug:  ibuprofen   lisinopril 20 MG tablet Commonly known as:  PRINIVIL,ZESTRIL   pantoprazole 40 MG tablet Commonly known as:  PROTONIX     TAKE  these medications   ethambutol 400 MG tablet Commonly known as:  MYAMBUTOL Take 3 tablets (1,200 mg total) by mouth daily.   fluticasone 50 MCG/ACT nasal spray Commonly known as:  FLONASE Place 1 spray into both nostrils 2 (two) times daily.   hydrochlorothiazide 12.5 MG tablet Commonly known as:  HYDRODIURIL Take 12.5 mg by mouth daily.   isoniazid 300 MG tablet Commonly known as:  NYDRAZID Take 1 tablet (300 mg total) by mouth daily.   NYQUIL COLD & FLU PO Take 30 mLs by mouth at bedtime as needed (pain).   omeprazole 40 MG capsule Commonly known as:  PRILOSEC Take 40 mg by mouth daily.   pyrazinamide 500 MG tablet Take 3 tablets (1,500 mg total) by mouth daily.   pyridOXINE 50 MG tablet Commonly known as:  B-6 Take 1 tablet (50 mg total) by mouth daily.   rifampin 300 MG capsule Commonly known as:  RIFADIN Take 2 capsules (600 mg total) by mouth daily.   simvastatin 20 MG tablet Commonly known as:  ZOCOR Take 20 mg by mouth daily.   Vitamin D (Ergocalciferol) 50000 units Caps capsule Commonly known as:  DRISDOL Take 50,000 Units by mouth once a week.      No Known Allergies    The results of significant diagnostics from this hospitalization (including imaging, microbiology, ancillary and laboratory) are listed below for reference.    Significant Diagnostic Studies: Dg Chest 2 View  Result Date: 10/09/2017 CLINICAL DATA:  Chest pain since friday EXAM: CHEST  2  VIEW COMPARISON:  Radiograph 11 10/2004. FINDINGS: Normal cardiac silhouette. There is subtle asymmetric LEFT suprahilar angular density not clearly seen on comparison exam. No consolidation. No pleural fluid or pneumothorax. No acute osseous abnormality. IMPRESSION: Subtle asymmetric LEFT suprahilar density. Recommend CT thorax for further evaluation Electronically Signed   By: Suzy Bouchard M.D.   On: 10/09/2017 14:00   Ct Chest Wo Contrast  Result Date: 10/09/2017 CLINICAL DATA:  Left-sided  chest pain and shortness of breath. Abnormal chest x-ray demonstrating suprahilar density in the left chest. EXAM: CT CHEST WITHOUT CONTRAST TECHNIQUE: Multidetector CT imaging of the chest was performed following the standard protocol without IV contrast. COMPARISON:  Chest x-ray earlier today. FINDINGS: Cardiovascular: Normal heart size. No pericardial fluid. Normal caliber thoracic aorta and central pulmonary arteries. No evidence of calcified coronary artery plaque. Mediastinum/Nodes: Numerous small high density calcified lymph nodes are identified throughout the mediastinum and in both hilar regions. Findings are suggestive of either prior granulomatous infection or other treated disease. No enlarged lymph nodes are identified. Lungs/Pleura: Dense subpleural airspace consolidation of the anterior left upper lobe noted extending from the anterior hilum and abutting the subpleural lung anteriorly and medially bordering the anterior mediastinum. There are some small pockets of cavitation within the consolidated lung and findings are consistent with acute pneumonia. Given the evidence potential prior granulomatous disease, active tuberculosis should be excluded. Upper Abdomen: Calcified gallstones identified in the gallbladder lumen. Musculoskeletal: No chest wall mass or suspicious bone lesions identified. IMPRESSION: Dense airspace consolidation of the anterior left upper lobe with small areas of internal cavitation. Findings are consistent with acute pneumonia. Associated numerous high density calcified lymph nodes throughout the mediastinum and in both hilar regions. These nodes are suggestive of either prior granulomatous infection or other treated disease. Active tuberculosis should be excluded based on the presence of these high density lymph nodes and the pneumonic consolidation present. Electronically Signed   By: Aletta Edouard M.D.   On: 10/09/2017 18:12    Microbiology: Recent Results (from the  past 240 hour(s))  Blood culture (routine x 2)     Status: None   Collection Time: 10/09/17  8:15 PM  Result Value Ref Range Status   Specimen Description BLOOD LEFT ANTECUBITAL  Final   Special Requests   Final    BOTTLES DRAWN AEROBIC AND ANAEROBIC Blood Culture adequate volume   Culture NO GROWTH 5 DAYS  Final   Report Status 10/14/2017 FINAL  Final  Blood culture (routine x 2)     Status: None   Collection Time: 10/09/17  8:47 PM  Result Value Ref Range Status   Specimen Description BLOOD LEFT ARM  Final   Special Requests   Final    BLOOD AEROBIC BOTTLE Blood Culture results may not be optimal due to an inadequate volume of blood received in culture bottles   Culture NO GROWTH 5 DAYS  Final   Report Status 10/14/2017 FINAL  Final  Acid Fast Smear (AFB)     Status: None   Collection Time: 10/11/17  5:00 AM  Result Value Ref Range Status   AFB Specimen Processing Concentration  Final   Acid Fast Smear Negative  Final    Comment: (NOTE) Performed At: [facility] [place], [place] [id number] Rush Farmer MD [id number]    Source (AFB) SPUTUM  Final  Acid Fast Smear (AFB)     Status: None   Collection Time: 10/11/17 12:27 PM  Result Value Ref Range Status  AFB Specimen Processing Concentration  Final   Acid Fast Smear Negative  Final    Comment: (NOTE) Performed At: [facility] [place], [place] [id number] Rush Farmer MD [id number]    Source (AFB) SPUTUM  Final  Culture, sputum-assessment     Status: None   Collection Time: 10/12/17 12:27 PM  Result Value Ref Range Status   Specimen Description EXPECTORATED SPUTUM  Final   Special Requests NONE  Final   Sputum evaluation THIS SPECIMEN IS ACCEPTABLE FOR SPUTUM CULTURE  Final   Report Status 10/12/2017 FINAL  Final  Culture, respiratory (NON-Expectorated)     Status: None   Collection Time: 10/12/17 12:27 PM  Result Value Ref Range Status   Specimen Description  EXPECTORATED SPUTUM  Final   Special Requests NONE Reflexed from [id number]  Final   Gram Stain   Final    RARE SQUAMOUS EPITHELIAL CELLS PRESENT MODERATE WBC PRESENT, PREDOMINANTLY PMN ABUNDANT GRAM POSITIVE COCCI IN CHAINS    Culture FEW Consistent with normal respiratory flora.  Final   Report Status 10/15/2017 FINAL  Final  Culture, bal-quantitative     Status: None (Preliminary result)   Collection Time: 10/15/17  1:20 PM  Result Value Ref Range Status   Specimen Description BRONCHIAL ALVEOLAR LAVAGE  Final   Special Requests Normal  Final   Gram Stain   Final    FEW WBC PRESENT, PREDOMINANTLY PMN FEW GRAM POSITIVE COCCI IN PAIRS RARE GRAM NEGATIVE RODS    Culture TOO YOUNG TO READ  Final   Report Status PENDING  Incomplete     Labs: Basic Metabolic Panel: Recent Labs  Lab 10/09/17 1333  NA 136  K 3.9  CL 99*  CO2 27  GLUCOSE 104*  BUN 10  CREATININE 0.88  CALCIUM 9.2   Liver Function Tests: No results for input(s): AST, ALT, ALKPHOS, BILITOT, PROT, ALBUMIN in the last 168 hours. No results for input(s): LIPASE, AMYLASE in the last 168 hours. No results for input(s): AMMONIA in the last 168 hours. CBC: Recent Labs  Lab 10/09/17 1333  WBC 6.6  HGB 12.8*  HCT 37.3*  MCV 86.3  PLT 206   Cardiac Enzymes: No results for input(s): CKTOTAL, CKMB, CKMBINDEX, TROPONINI in the last 168 hours. BNP: BNP (last 3 results) No results for input(s): BNP in the last 8760 hours.  ProBNP (last 3 results) No results for input(s): PROBNP in the last 8760 hours.  CBG: No results for input(s): GLUCAP in the last 168 hours.     Signed:  Elwin Mocha MD  FACP  [facility] 10/16/2017, 1:12 PM

## 2017-10-17 LAB — QUANTIFERON-TB GOLD PLUS (RQFGPL)
QUANTIFERON MITOGEN VALUE: 0.25 [IU]/mL
QUANTIFERON TB1 AG VALUE: 0.06 [IU]/mL
QuantiFERON Nil Value: 0.07 IU/mL
QuantiFERON TB2 Ag Value: 0.06 IU/mL

## 2017-10-17 LAB — ACID FAST SMEAR (AFB): ACID FAST SMEAR - AFSCU2: NEGATIVE

## 2017-10-17 LAB — QUANTIFERON-TB GOLD PLUS: QuantiFERON-TB Gold Plus: UNDETERMINED

## 2017-10-17 LAB — ACID FAST SMEAR (AFB, MYCOBACTERIA)

## 2017-10-18 LAB — CULTURE, BAL-QUANTITATIVE W GRAM STAIN: Culture: 40000 — AB

## 2017-10-18 LAB — CULTURE, BAL-QUANTITATIVE: SPECIAL REQUESTS: NORMAL

## 2017-11-13 LAB — MAC SUSCEPTIBILITY BROTH
Ciprofloxacin: 1
Clarithromycin: 0.5
Rifampin: 1

## 2017-11-13 LAB — AFB ORGANISM ID BY DNA PROBE
M AVIUM COMPLEX: POSITIVE — AB
M TUBERCULOSIS COMPLEX: NEGATIVE

## 2017-11-13 LAB — ACID FAST CULTURE WITH REFLEXED SENSITIVITIES (MYCOBACTERIA): Acid Fast Culture: POSITIVE — AB

## 2017-11-15 LAB — AFB ORGANISM ID BY DNA PROBE
M avium complex: NEGATIVE
M tuberculosis complex: NEGATIVE

## 2017-11-15 LAB — RAPID GROWER BROTH SUSCEP.
Clarithromycin: 16
Linezolid: 8
Minocycline: >8
Moxifloxacin: 8
Tigecycline: 0.5

## 2017-11-15 LAB — ORG ID BY SEQUENCING RFLX AST

## 2017-11-15 LAB — ACID FAST CULTURE WITH REFLEXED SENSITIVITIES (MYCOBACTERIA): Acid Fast Culture: POSITIVE — AB

## 2017-11-26 ENCOUNTER — Ambulatory Visit (INDEPENDENT_AMBULATORY_CARE_PROVIDER_SITE_OTHER): Payer: 59 | Admitting: Infectious Diseases

## 2017-11-26 ENCOUNTER — Encounter: Payer: Self-pay | Admitting: Infectious Diseases

## 2017-11-26 DIAGNOSIS — A150 Tuberculosis of lung: Secondary | ICD-10-CM | POA: Diagnosis not present

## 2017-11-26 DIAGNOSIS — J984 Other disorders of lung: Secondary | ICD-10-CM

## 2017-11-26 NOTE — Assessment & Plan Note (Signed)
He does not have TB Will remove this from his problem list.

## 2017-11-26 NOTE — Progress Notes (Signed)
   Subjective:    Patient ID: Jason Clark, male    DOB: Mar 06, 1956, 62 y.o.   MRN: [id number]  HPI 62 y.o. male with no PMHx, originally from [place], came to [place] "2000 something" . Last visit to his home country was  (can't remember).  No hx of incarceration, homelessness.  Came to ED on 10-09-17 with CP for 3 weeks and cough and hemoptysis over the last 4 days.  He had sputum sent for AFB which showed negative stains. He had BAL on 12-18 which was consistent with normal flora. He was started on 4 drug rx empirically and then found to have  M avium from one of his sputum cx. M abscessus grew from his broth cx. S- linezolid and amikacin.   He has been feeling well.  He denies SOB or cough.  No hemoptysis.  Denies wt loss.  He is still taking rx from health dept.   He was offered an interpreter but refuses. I am not clear that he understands.  He states he was told he doesn't have tuberculosis and he went back to work.   Review of Systems  Unable to perform ROS: Other       Objective:   Physical Exam  Constitutional: He appears well-developed and well-nourished.  HENT:  Mouth/Throat: No oropharyngeal exudate.  Eyes: EOM are normal. Pupils are equal, round, and reactive to light.  Neck: Neck supple.  Cardiovascular: Normal rate, regular rhythm and normal heart sounds.  Pulmonary/Chest: Effort normal and breath sounds normal.  Abdominal: Soft. Bowel sounds are normal. There is no tenderness. There is no rebound.  Musculoskeletal: He exhibits no edema.  Lymphadenopathy:    He has no cervical adenopathy.    He has no axillary adenopathy.       Assessment & Plan:

## 2017-11-26 NOTE — Assessment & Plan Note (Signed)
He had Cx showing M avium and M abscess from his sputum.  He had these in only 1 cx so I am not sure that they meet strict criteria for dx.  Will contact [facility] I also called the [place] health dept to get their info on him.  Will call him back when we have more info.

## 2017-11-27 DIAGNOSIS — J309 Allergic rhinitis, unspecified: Secondary | ICD-10-CM | POA: Diagnosis not present

## 2017-11-27 DIAGNOSIS — E785 Hyperlipidemia, unspecified: Secondary | ICD-10-CM | POA: Diagnosis not present

## 2017-11-27 DIAGNOSIS — I10 Essential (primary) hypertension: Secondary | ICD-10-CM | POA: Diagnosis not present

## 2017-11-28 LAB — ACID FAST CULTURE WITH REFLEXED SENSITIVITIES (MYCOBACTERIA): Acid Fast Culture: NEGATIVE

## 2018-01-13 IMAGING — CT CT CHEST W/O CM
2 of 3 series · 15 of 36 positions shown, 18 images · non-contrast
Comparison: Chest x-ray earlier today.

CLINICAL DATA: Left-sided chest pain and shortness of breath.
Abnormal chest x-ray demonstrating suprahilar density in the left
chest.

EXAM:
CT CHEST WITHOUT CONTRAST
TECHNIQUE: Multidetector CT imaging of the chest was performed following the
standard protocol without IV contrast.

[Series 3: chest w/o 2mm st · axial · non-contrast · 0.72mm/px · z∈[-162,+132]mm · 12 of 173 slices shown, 15 images]
[im 13/173  mediastinal]
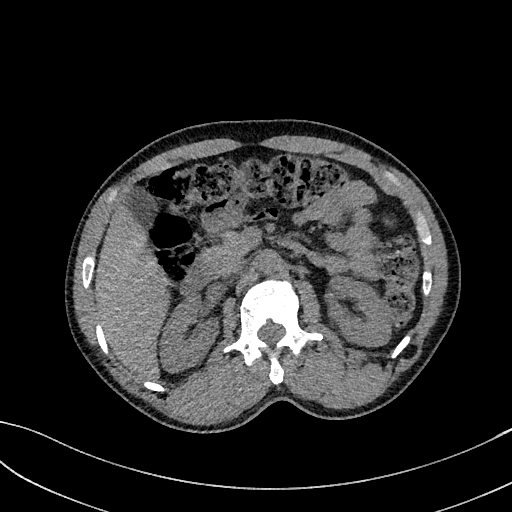
[im 13/173  lung]
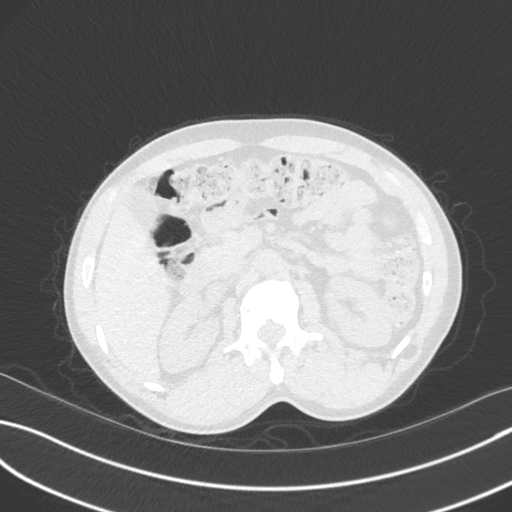
[im 26/173  lung]
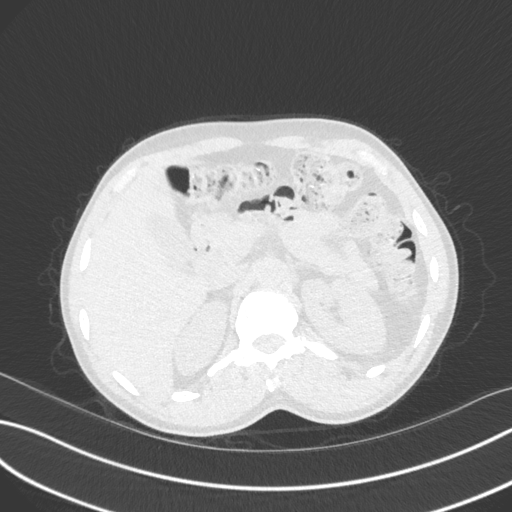
[im 39/173  lung]
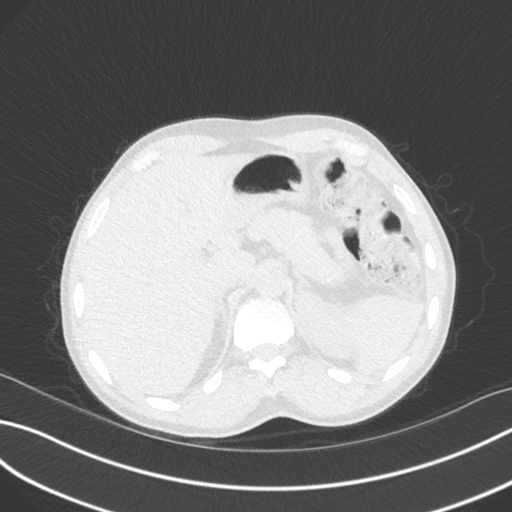
[im 51/173  lung]
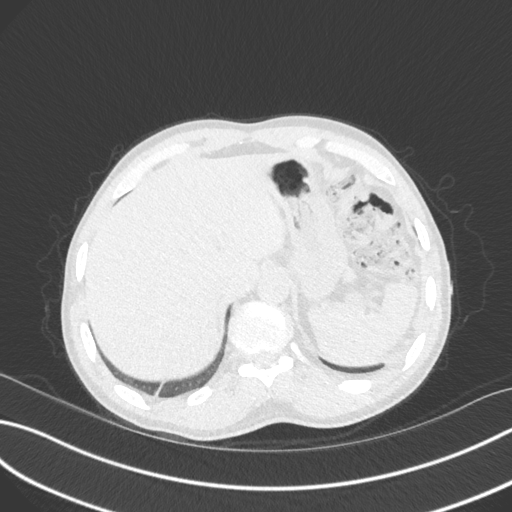
[im 64/173  mediastinal]
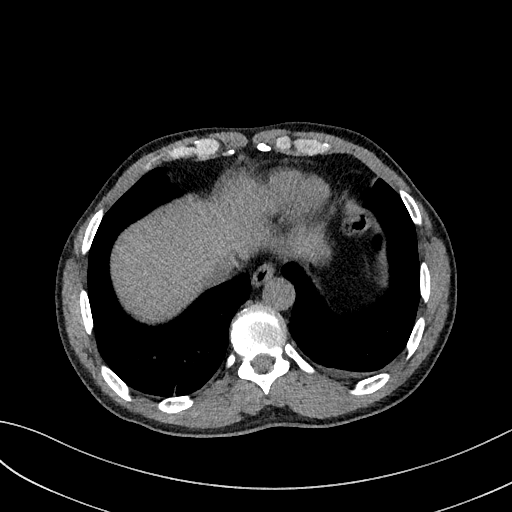
[im 64/173  lung]
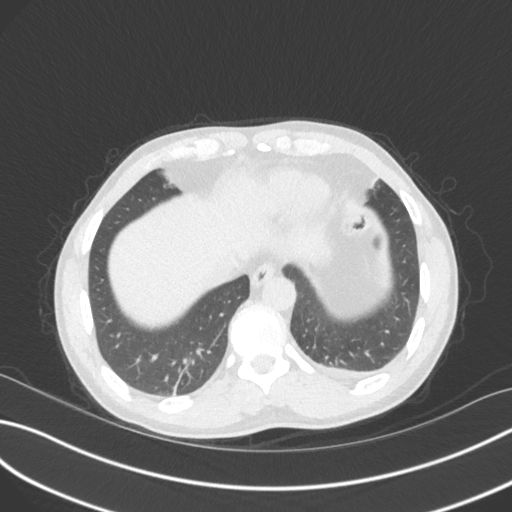
[im 77/173  lung]
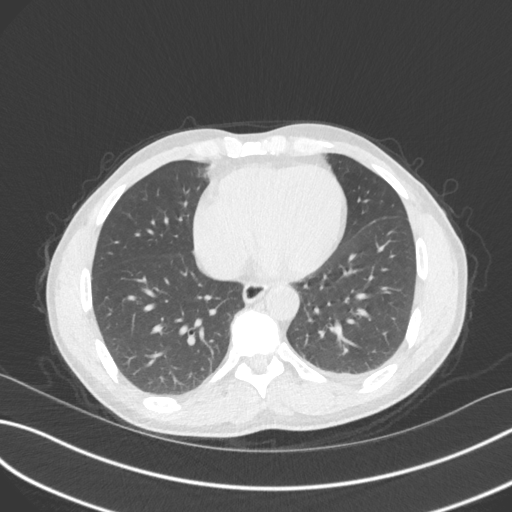
[im 96/173  lung]
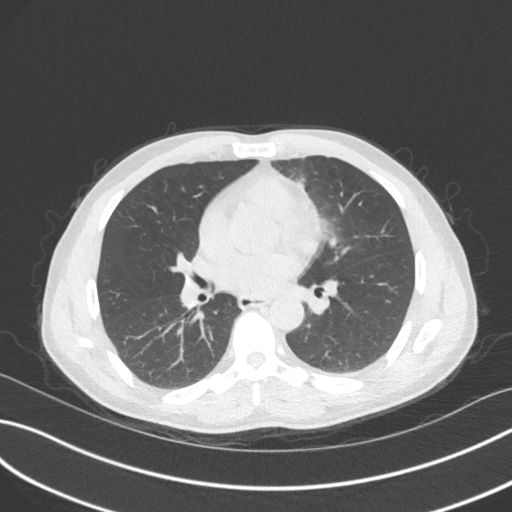
[im 109/173  lung]
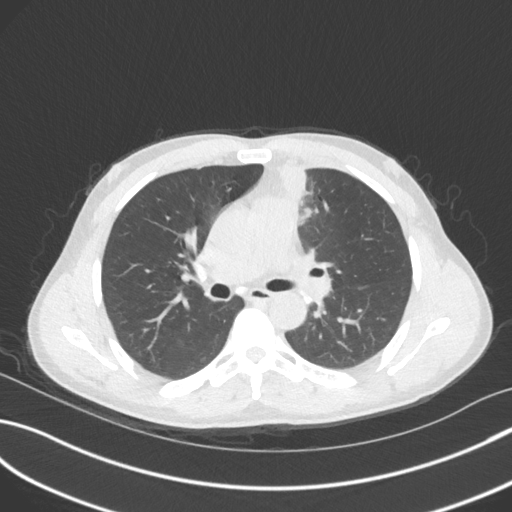
[im 122/173  mediastinal]
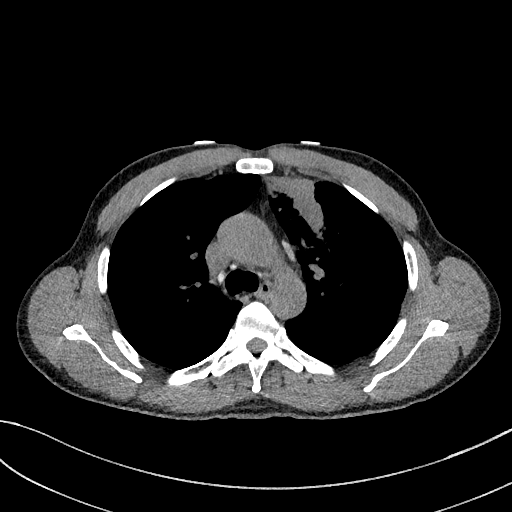
[im 122/173  lung]
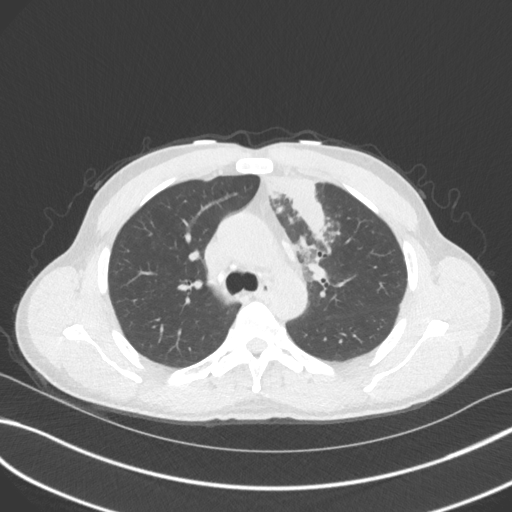
[im 134/173  lung]
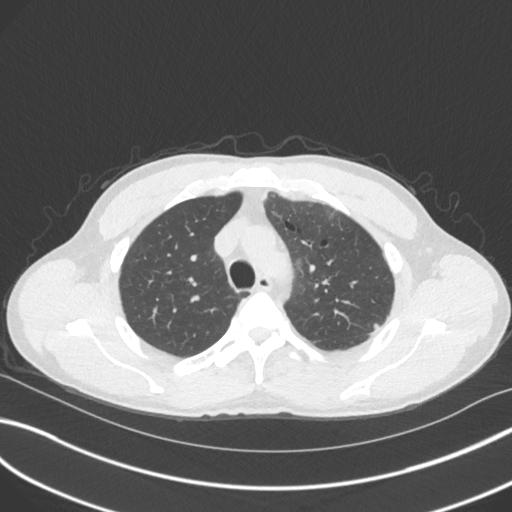
[im 147/173  lung]
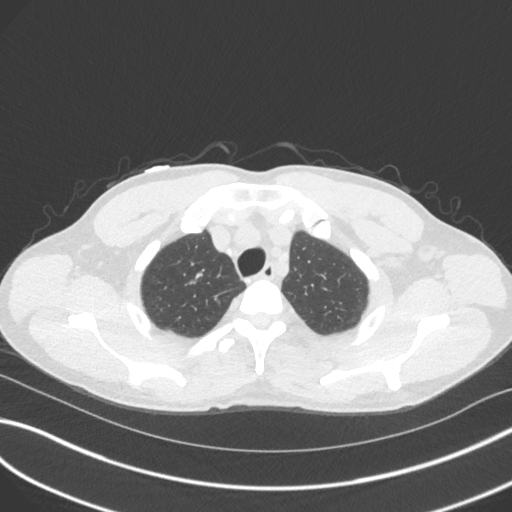
[im 160/173  lung]
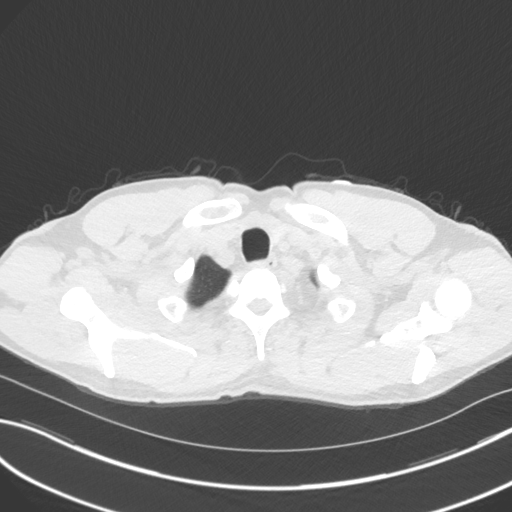

[Series 5: chest w/o 3mm st cor · coronal · non-contrast · 0.67mm/px · 3 of 80 slices shown]
[im 16/80  lung]
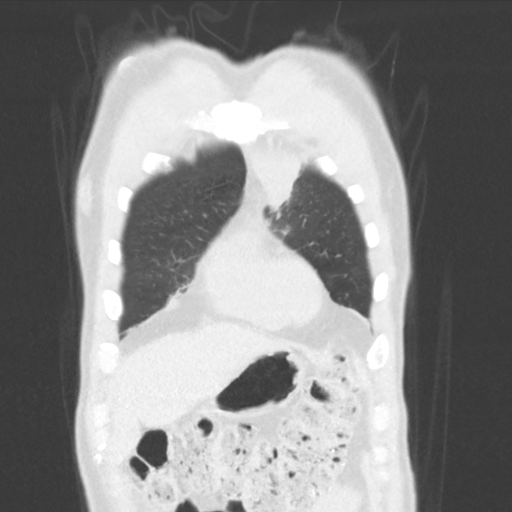
[im 32/80  lung]
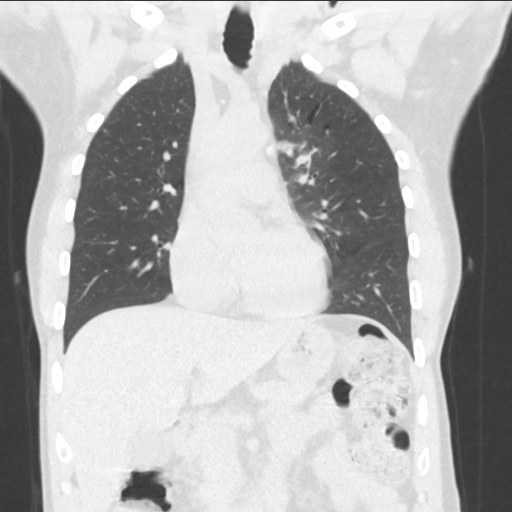
[im 48/80  lung]
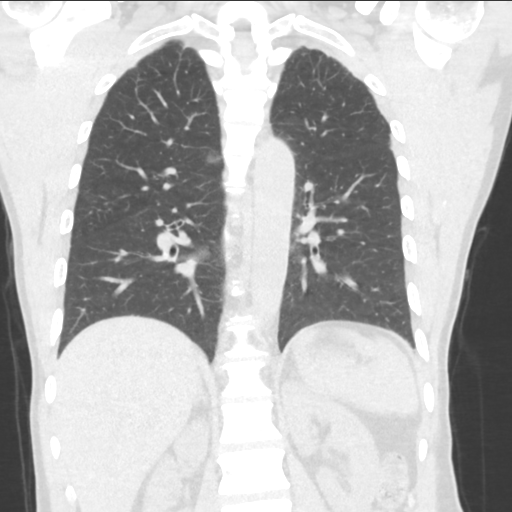

[15 of 36 positions shown; findings below may reference images not displayed]

FINDINGS: Cardiovascular: Normal heart size. No pericardial fluid. Normal
caliber thoracic aorta and central pulmonary arteries. No evidence
of calcified coronary artery plaque.

Mediastinum/Nodes: Numerous small high density calcified lymph nodes
are identified throughout the mediastinum and in both hilar regions.
Findings are suggestive of either prior granulomatous infection or
other treated disease. No enlarged lymph nodes are identified.

Lungs/Pleura: Dense subpleural airspace consolidation of the
anterior left upper lobe noted extending from the anterior hilum and
abutting the subpleural lung anteriorly and medially bordering the
anterior mediastinum. There are some small pockets of cavitation
within the consolidated lung and findings are consistent with acute
pneumonia. Given the evidence potential prior granulomatous disease,
active tuberculosis should be excluded.

Upper Abdomen: Calcified gallstones identified in the gallbladder
lumen.

Musculoskeletal: No chest wall mass or suspicious bone lesions
identified.
IMPRESSION: Dense airspace consolidation of the anterior left upper lobe with
small areas of internal cavitation. Findings are consistent with
acute pneumonia. Associated numerous high density calcified lymph
nodes throughout the mediastinum and in both hilar regions. These
nodes are suggestive of either prior granulomatous infection or
other treated disease. Active tuberculosis should be excluded based
on the presence of these high density lymph nodes and the pneumonic
consolidation present.

## 2018-01-27 ENCOUNTER — Telehealth: Payer: Self-pay | Admitting: Infectious Diseases

## 2018-01-27 NOTE — Telephone Encounter (Signed)
[facility] He needs further positive samples to be considered diagnostic.  I left a VM on his phone relaying this.

## 2018-02-04 NOTE — Telephone Encounter (Signed)
Pt called today with questions regarding voicemail left for him would like a call back with some clarification. Lorenso CourierJose L Maldonado, [place]

## 2018-02-07 DIAGNOSIS — K219 Gastro-esophageal reflux disease without esophagitis: Secondary | ICD-10-CM | POA: Diagnosis not present

## 2018-02-07 DIAGNOSIS — J309 Allergic rhinitis, unspecified: Secondary | ICD-10-CM | POA: Diagnosis not present

## 2018-02-07 DIAGNOSIS — J45901 Unspecified asthma with (acute) exacerbation: Secondary | ICD-10-CM | POA: Diagnosis not present

## 2019-03-02 DIAGNOSIS — K219 Gastro-esophageal reflux disease without esophagitis: Secondary | ICD-10-CM | POA: Diagnosis not present

## 2019-03-02 DIAGNOSIS — K649 Unspecified hemorrhoids: Secondary | ICD-10-CM | POA: Diagnosis not present

## 2019-03-02 DIAGNOSIS — I10 Essential (primary) hypertension: Secondary | ICD-10-CM | POA: Diagnosis not present
